# Patient Record
Sex: Female | Born: 1980 | Race: White | Hispanic: No | Marital: Married | Smoking: Former smoker
Health system: Southern US, Academic
[De-identification: ages and names within clinical notes are randomized; demographics above are authoritative.]

## PROBLEM LIST (undated history)

## (undated) DIAGNOSIS — G4700 Insomnia, unspecified: Secondary | ICD-10-CM

## (undated) DIAGNOSIS — J309 Allergic rhinitis, unspecified: Secondary | ICD-10-CM

## (undated) DIAGNOSIS — K529 Noninfective gastroenteritis and colitis, unspecified: Secondary | ICD-10-CM

## (undated) DIAGNOSIS — F32A Depression, unspecified: Secondary | ICD-10-CM

## (undated) DIAGNOSIS — Z28310 Unvaccinated for covid-19: Secondary | ICD-10-CM

## (undated) DIAGNOSIS — Z2821 Immunization not carried out because of patient refusal: Secondary | ICD-10-CM

## (undated) DIAGNOSIS — Z9049 Acquired absence of other specified parts of digestive tract: Secondary | ICD-10-CM

## (undated) DIAGNOSIS — F419 Anxiety disorder, unspecified: Secondary | ICD-10-CM

## (undated) DIAGNOSIS — R519 Headache, unspecified: Secondary | ICD-10-CM

## (undated) HISTORY — PX: KNEE ARTHROSCOPY: SUR90

## (undated) HISTORY — PX: HX CARPAL TUNNEL RELEASE: SHX101

## (undated) HISTORY — PX: HX HYSTERECTOMY: SHX81

## (undated) HISTORY — DX: Insomnia, unspecified: G47.00

## (undated) HISTORY — DX: Headache, unspecified: R51.9

## (undated) HISTORY — PX: VASCULAR SURGERY: SHX849

## (undated) HISTORY — DX: Acquired absence of other specified parts of digestive tract: Z90.49

## (undated) HISTORY — DX: Noninfective gastroenteritis and colitis, unspecified: K52.9

## (undated) HISTORY — DX: Anxiety disorder, unspecified: F41.9

## (undated) HISTORY — DX: Allergic rhinitis, unspecified: J30.9

## (undated) HISTORY — DX: Depression, unspecified: F32.A

## (undated) HISTORY — DX: Unvaccinated for covid-19: Z28.310

## (undated) HISTORY — DX: Immunization not carried out because of patient refusal: Z28.21

---
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
## 2019-12-08 IMAGING — MG 3D SCREENING MAMMO BIL W/CAD
5 series · 7 of 24 positions shown · non-contrast
Comparison: None.  This is a baseline exam.

------------- REPORT [id number] -------------
[facility]
[address]
Daina Ms.TIGER, ALREEM:
We wish to report the following on your recent mammography examination. We are sending a report to your referring physician or other health care provider. 
(       Normal/Negative:
No evidence of cancer.
This statement is mandated by the [place], Department of Health.
Your examination was performed by one of our technologists, who are registered radiological technologists and also specially certified in mammography:
___
Parlak, Edaly (M)
Nepomuceno, Martinez (M)

Your mammogram was interpreted by our radiologist.
( 
Sofeine Made, M.D.
(Annual Breast Examination by a physician or other health care provider
(Annual Mammography Screening beginning at age 40
(Monthly Breast Self Examination
------------- REPORT [id number] -------------
EXAM:  3D BILATERAL ANNUAL SCREENING DIGITAL MAMMOGRAM WITH CAD AND TOMOSYNTHESIS
INDICATION: Screening.

[R]
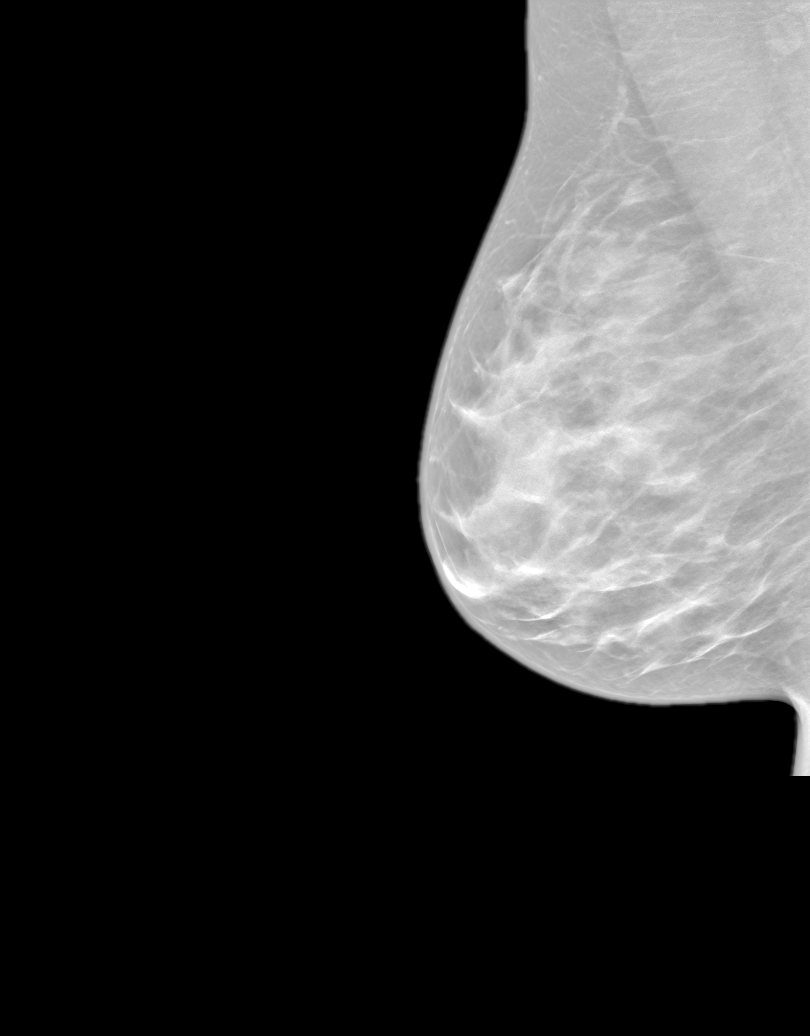

[Series 5715: R CC · right · 2 of 2 slices shown]
[im 1/2]
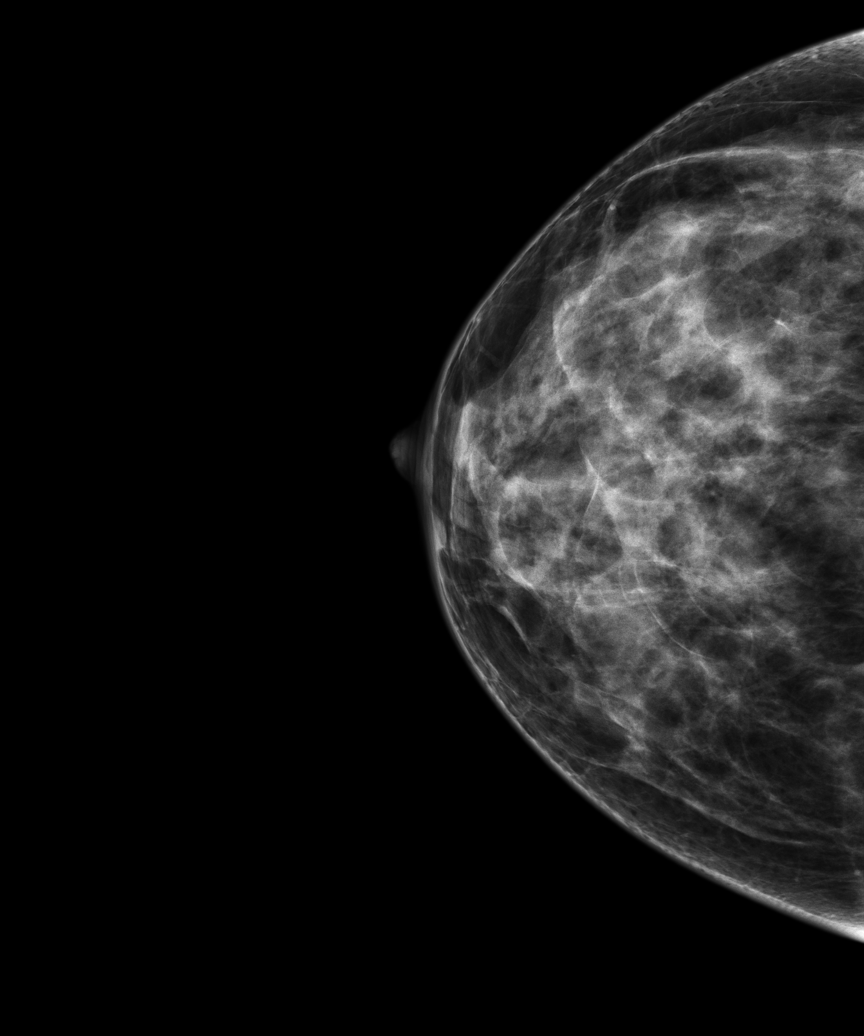
[im 2/2]
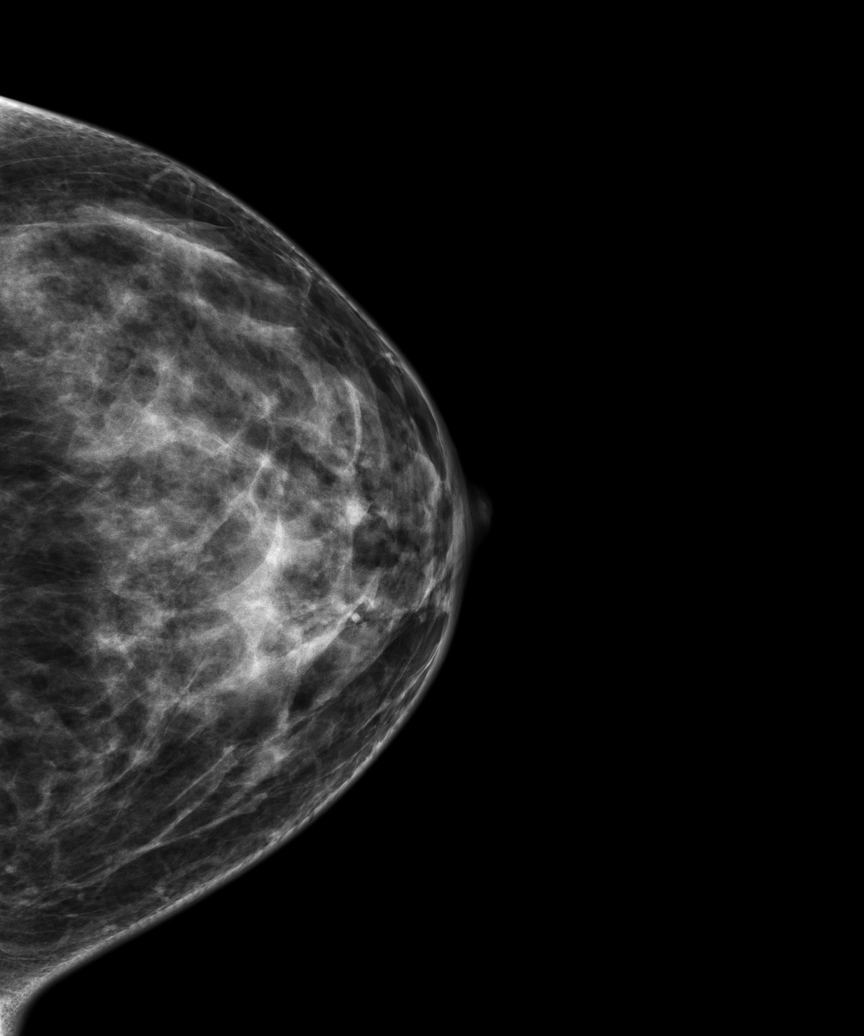

[Series 5717: 3D SCREENING MAMMO BIL W/CAD · 2 acquisitions, 2 frames shown (1 of 2)]
[im 1/2]
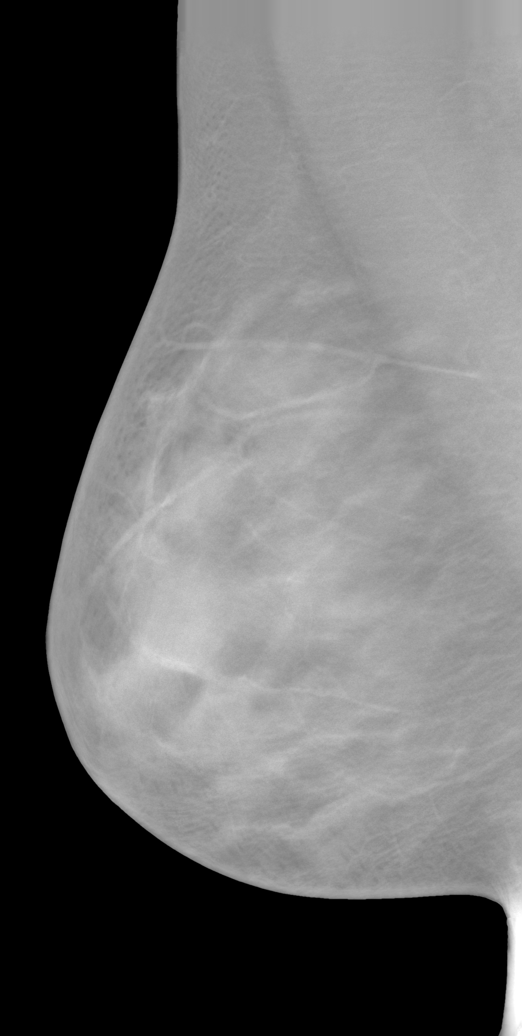
[im 2/2]
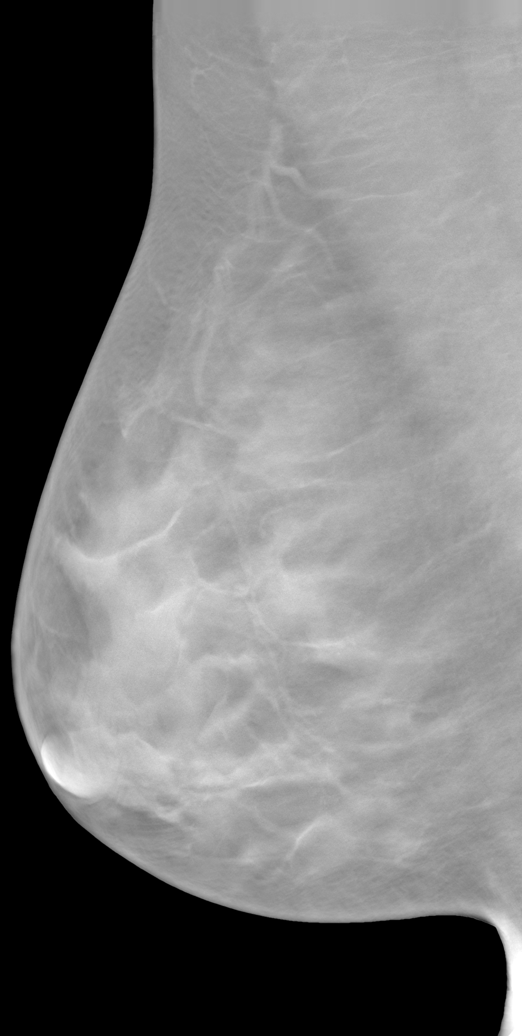

[3D SCREENING MAMMO BIL W/CAD (2 of 2) · tomo slice 11/68.0]
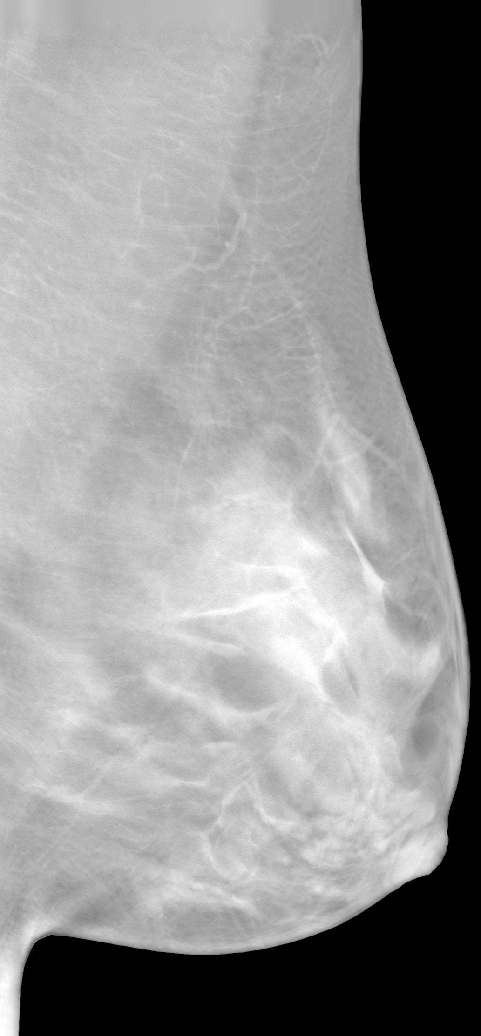

[L]
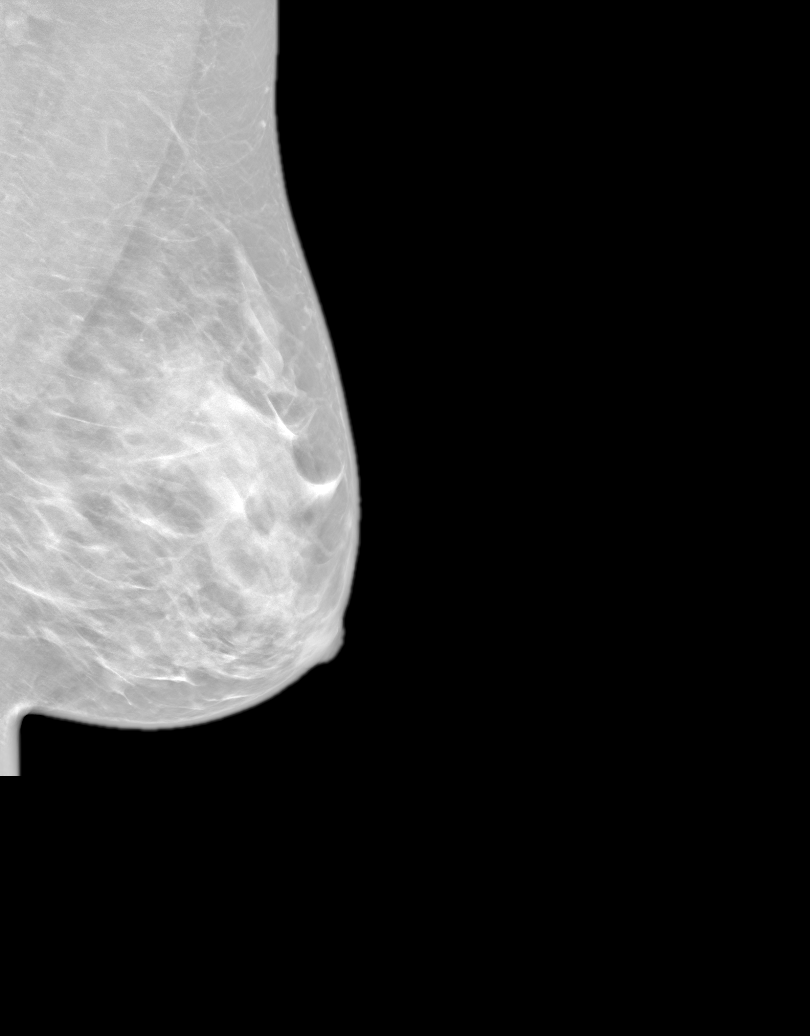

[7 of 24 positions shown; findings below may reference images not displayed]

FINDINGS: Breast parenchyma is heterogeneously dense.  There is no mass or suspicious cluster of microcalcifications.  There is no architectural distortion, skin thickening or nipple retraction.
IMPRESSION: 1.  BIRADS 2-Benign findings. Patient has been added in a reminder system with a target date for the next screening mammography.

2.  DENSITY CODE – C (Heterogeneously dense). 

Final Assessment Code:

Bi-Rads 2 

BI-RADS 0
Need additional imaging evaluation.

BI-RADS 1
Negative mammogram.

BI-RADS 2
Benign finding.

BI-RADS 3
Probably benign finding; short-interval follow-up suggested.

BI-RADS 4
Suspicious abnormality; biopsy should be considered.

BI-RADS 5
Highly suggestive of malignancy; appropriate action should be taken.

BI-RADS 6
Known biopsy-proven malignancy; appropriate action should be taken.

NOTE:
In compliance with Federal regulations, the results of this mammogram are being sent to the patient.

## 2020-11-28 DIAGNOSIS — N393 Stress incontinence (female) (male): Secondary | ICD-10-CM | POA: Insufficient documentation

## 2020-11-30 DIAGNOSIS — R102 Pelvic and perineal pain: Secondary | ICD-10-CM | POA: Insufficient documentation

## 2020-11-30 DIAGNOSIS — N941 Unspecified dyspareunia: Secondary | ICD-10-CM | POA: Insufficient documentation

## 2021-02-18 DIAGNOSIS — F32A Depression, unspecified: Secondary | ICD-10-CM | POA: Insufficient documentation

## 2021-02-18 DIAGNOSIS — G4700 Insomnia, unspecified: Secondary | ICD-10-CM | POA: Insufficient documentation

## 2021-02-18 DIAGNOSIS — F419 Anxiety disorder, unspecified: Secondary | ICD-10-CM | POA: Insufficient documentation

## 2021-02-18 DIAGNOSIS — J302 Other seasonal allergic rhinitis: Secondary | ICD-10-CM | POA: Insufficient documentation

## 2021-07-02 IMAGING — MG 3D SCREENING MAMMO BIL W/CAD & TOMO
5 series · 8 of 24 positions shown · non-contrast
Comparison: 01/12/2021

------------- REPORT [id number] -------------
﻿

EXAM:  3D BILATERAL ANNUAL SCREENING DIGITAL MAMMOGRAM WITH CAD AND TOMOSYNTHESIS
INDICATION: Screening.

[R]
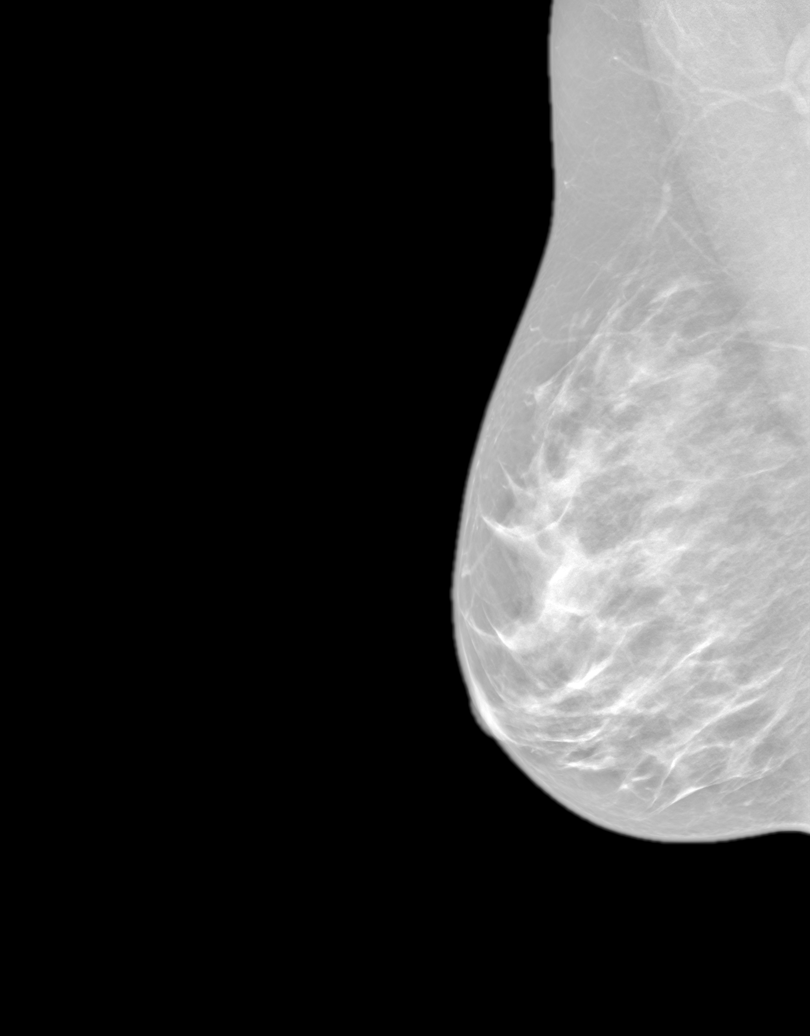

[L]
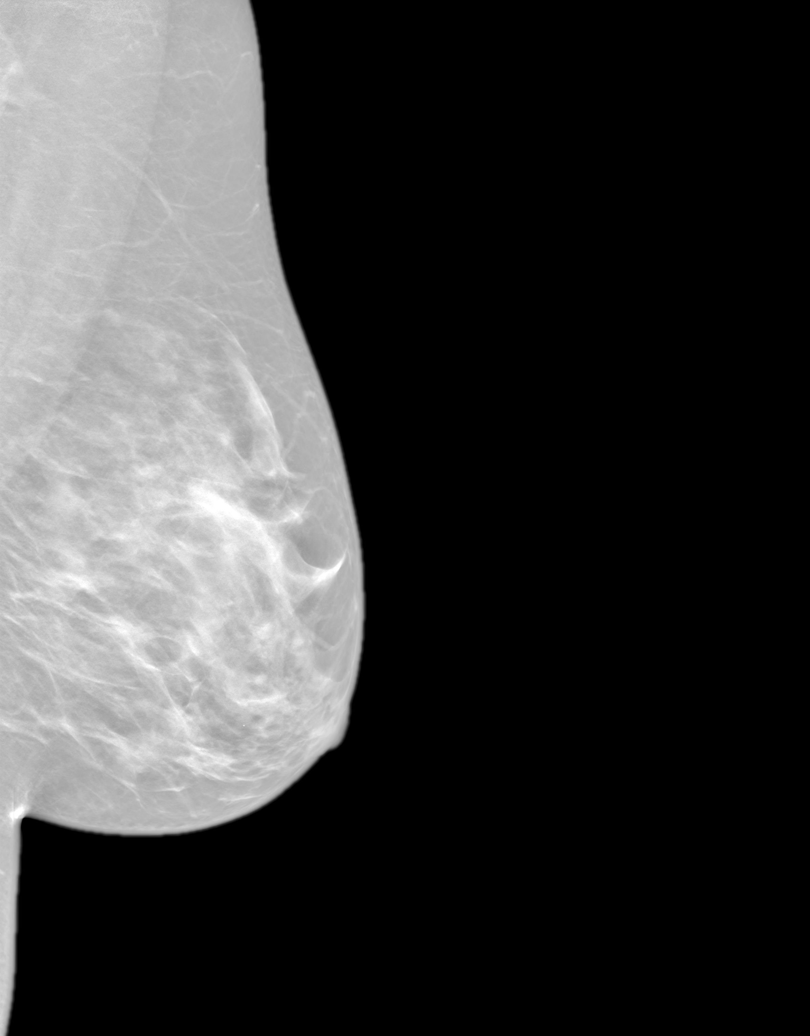

[R CC tomo · right · 0.10mm/px · 2 of 2 slices shown]
[im 1/2]
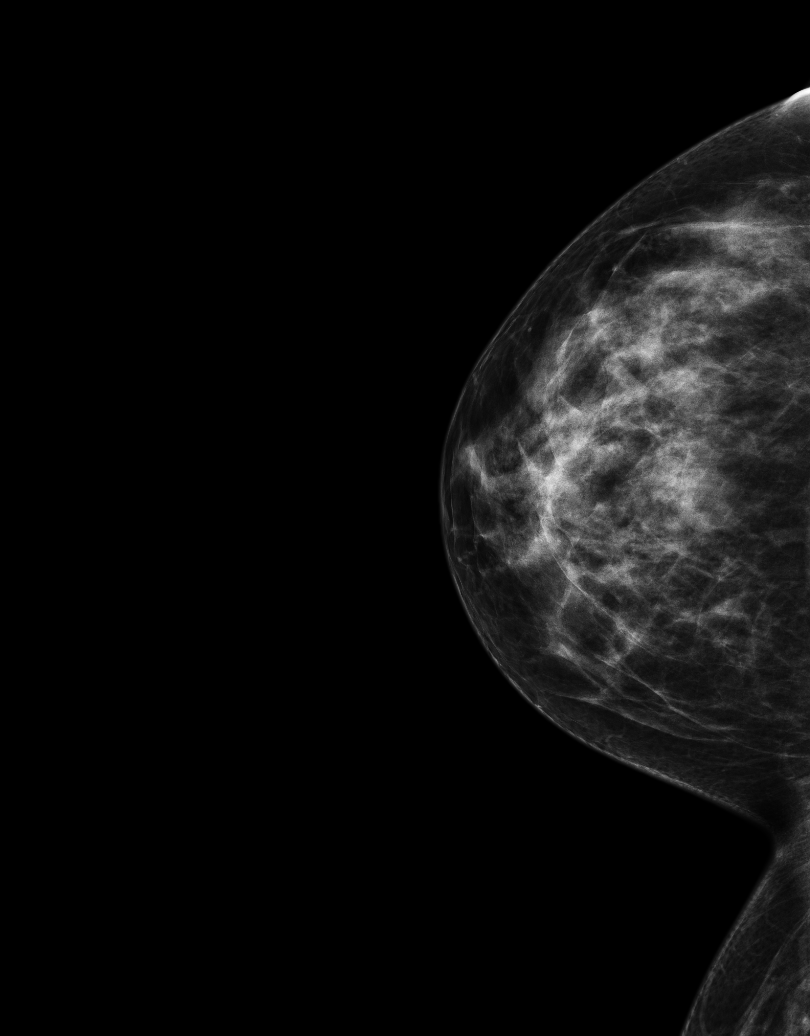
[im 2/2]
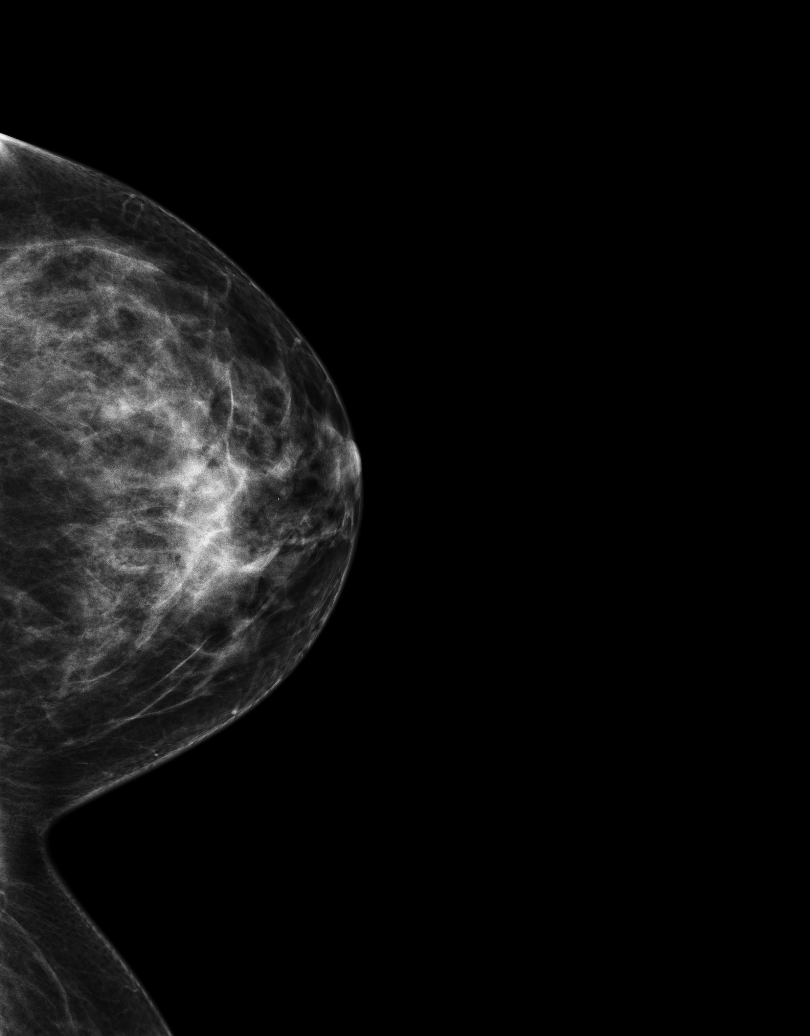

[3D SCREENING MAMMO BIL W/CAD & TOMO tomo · 2 acquisitions, 3 frames shown (1 of 2)]
[im 1/2]
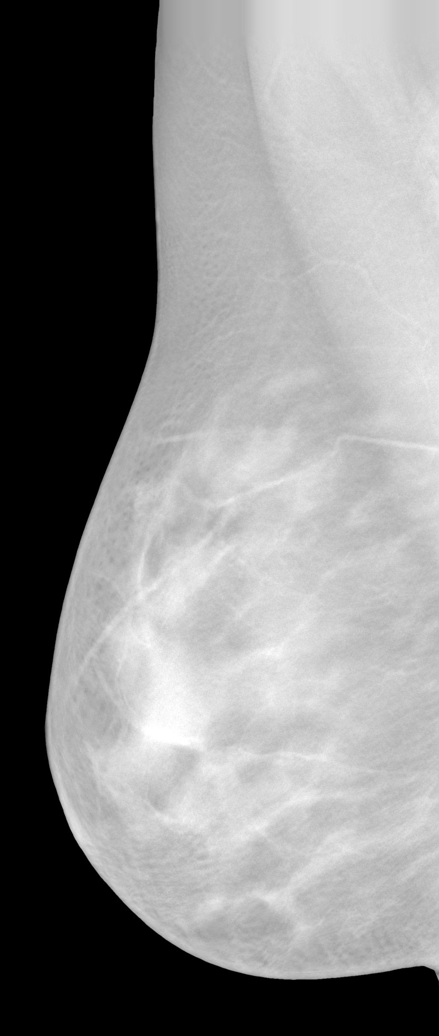
[im 2/2]
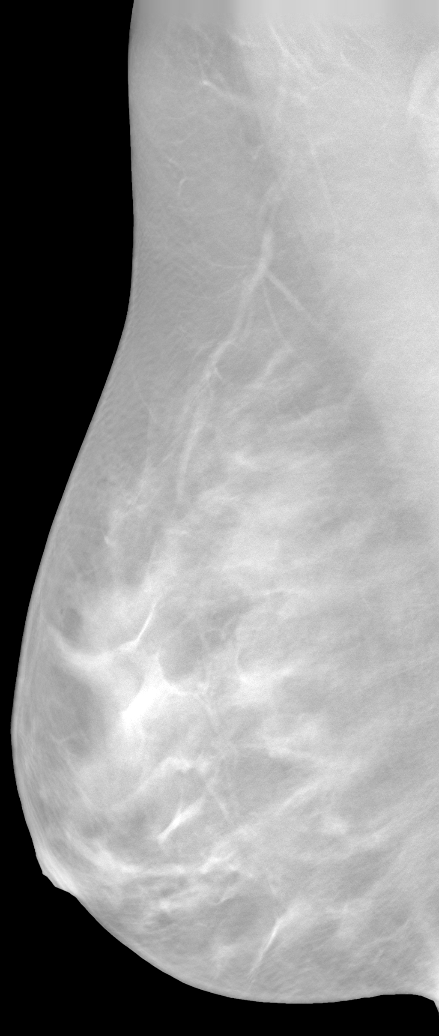
[im 2/2]
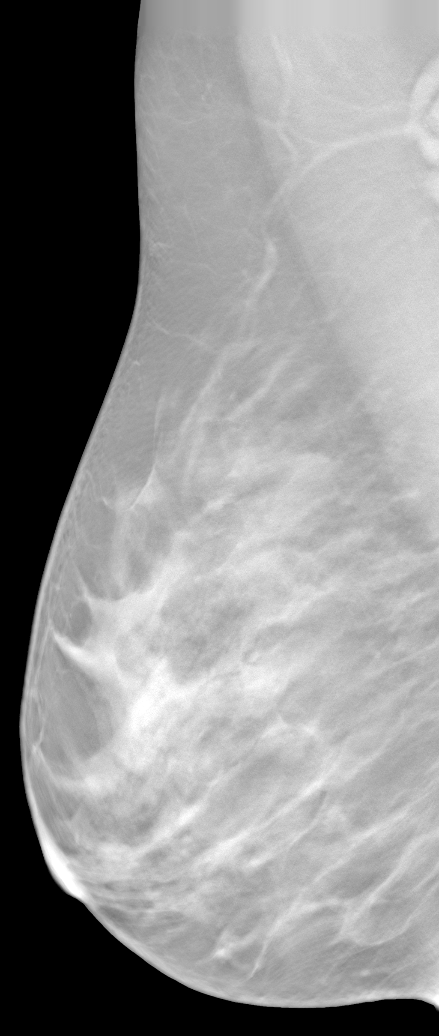

[3D SCREENING MAMMO BIL W/CAD & TOMO tomo (2 of 2) · tomo slice 13/78.0]
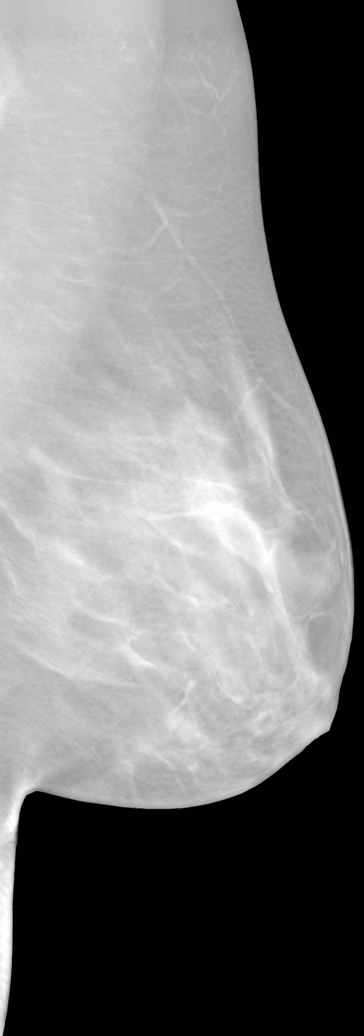

[8 of 24 positions shown; findings below may reference images not displayed]

FINDINGS: Breast parenchyma is heterogeneously dense.  There is no mass or suspicious cluster of microcalcifications.  There is no architectural distortion, skin thickening or nipple retraction.
IMPRESSION: 1.  BIRADS 2-Benign findings. Patient has been added in a reminder system with a target date for the next screening mammography.

2.  DENSITY CODE – C (Heterogeneously dense). 

Final Assessment Code:

Bi-Rads 2 

BI-RADS 0
 Need additional imaging evaluation.

BI-RADS 1
 Negative mammogram.

BI-RADS 2
 Benign finding.

BI-RADS 3
 Probably benign finding; short-interval follow-up suggested.

BI-RADS 4
 Suspicious abnormality; biopsy should be considered.

BI-RADS 5
 Highly suggestive of malignancy; appropriate action should be taken.

BI-RADS 6
 Known biopsy-proven malignancy; appropriate action should be taken.

NOTE:
In compliance with Federal regulations, the results of this mammogram are being sent to the patient.

------------- REPORT [id number] -------------
[facility]
[address]
Tiger Ms.ANTONANGELI, ARANEAE:
We wish to report the following on your recent mammography examination. We are sending a report to your referring physician or other health care provider. 
(       Normal/Negative:
No evidence of cancer.
This statement is mandated by the [place], Department of Health.
Your examination was performed by one of our technologists, who are registered radiological technologists and also specially certified in mammography:
___
Markland, Marjuan (M)

Your mammogram was interpreted by our radiologist.

( 
Collette Sedman, M.D.

(Annual Breast Examination by a physician or other health care provider
(Annual Mammography Screening beginning at age 40
(Monthly Breast Self Examination

## 2021-08-15 ENCOUNTER — Encounter (RURAL_HEALTH_CENTER): Payer: Self-pay | Admitting: Physician Assistant

## 2021-08-15 DIAGNOSIS — J309 Allergic rhinitis, unspecified: Secondary | ICD-10-CM | POA: Insufficient documentation

## 2021-08-15 DIAGNOSIS — F32A Depression, unspecified: Secondary | ICD-10-CM | POA: Insufficient documentation

## 2021-08-15 DIAGNOSIS — F419 Anxiety disorder, unspecified: Secondary | ICD-10-CM | POA: Insufficient documentation

## 2021-09-17 ENCOUNTER — Other Ambulatory Visit: Payer: Self-pay

## 2021-09-17 ENCOUNTER — Encounter (RURAL_HEALTH_CENTER): Payer: Self-pay | Admitting: Physician Assistant

## 2021-09-17 ENCOUNTER — Ambulatory Visit (RURAL_HEALTH_CENTER): Payer: Commercial Managed Care - PPO | Attending: Physician Assistant | Admitting: Physician Assistant

## 2021-09-17 VITALS — BP 120/88 | HR 74 | Temp 97.7°F | Ht 64.0 in | Wt 201.0 lb

## 2021-09-17 DIAGNOSIS — Z28310 Unvaccinated for covid-19: Secondary | ICD-10-CM | POA: Insufficient documentation

## 2021-09-17 DIAGNOSIS — Z2821 Immunization not carried out because of patient refusal: Secondary | ICD-10-CM | POA: Insufficient documentation

## 2021-09-17 DIAGNOSIS — R5382 Chronic fatigue, unspecified: Secondary | ICD-10-CM | POA: Insufficient documentation

## 2021-09-17 DIAGNOSIS — F419 Anxiety disorder, unspecified: Secondary | ICD-10-CM | POA: Insufficient documentation

## 2021-09-17 DIAGNOSIS — Z79899 Other long term (current) drug therapy: Secondary | ICD-10-CM | POA: Insufficient documentation

## 2021-09-17 DIAGNOSIS — R040 Epistaxis: Secondary | ICD-10-CM | POA: Insufficient documentation

## 2021-09-17 DIAGNOSIS — R079 Chest pain, unspecified: Secondary | ICD-10-CM | POA: Insufficient documentation

## 2021-09-17 DIAGNOSIS — F32A Depression, unspecified: Secondary | ICD-10-CM | POA: Insufficient documentation

## 2021-09-17 DIAGNOSIS — R5383 Other fatigue: Secondary | ICD-10-CM

## 2021-09-17 DIAGNOSIS — R519 Headache, unspecified: Secondary | ICD-10-CM | POA: Insufficient documentation

## 2021-09-17 DIAGNOSIS — R030 Elevated blood-pressure reading, without diagnosis of hypertension: Secondary | ICD-10-CM | POA: Insufficient documentation

## 2021-09-17 MED ORDER — AMLODIPINE 2.5 MG TABLET
2.5000 mg | ORAL_TABLET | Freq: Every day | ORAL | 4 refills | Status: DC
Start: 2021-09-17 — End: 2021-12-17

## 2021-09-17 MED ORDER — ESCITALOPRAM 10 MG TABLET
10.0000 mg | ORAL_TABLET | Freq: Every day | ORAL | 1 refills | Status: DC
Start: 2021-09-17 — End: 2021-10-03

## 2021-09-17 MED ORDER — BUTALBITAL-ASPIRIN-CAFFEINE 50 MG-325 MG-40 MG CAPSULE
1.0000 | ORAL_CAPSULE | ORAL | 2 refills | Status: AC | PRN
Start: 2021-09-17 — End: 2021-10-17

## 2021-09-17 NOTE — Nursing Note (Signed)
Patient here today for 3 months CDM .Been having headaches x 2 weeks.

## 2021-09-17 NOTE — Progress Notes (Incomplete)
INTERNAL MEDICINE, [facility]  [address]  [place] [address]  Operated by [facility]  History and Physical     Name: Stacy Cantrell MRN:  [id number]   Date: 09/17/2021 Age: 41 y.o.               Follow Up        Reason for Visit: Follow Up 3 Months (Headache x 2 weeks)    History of Present Illness  Stacy Cantrell is a 41 y.o. female who is being seen today for ***      Follow-up chronic anxiety/depression with patient currently taking Lexapro 10 mg daily along with BuSpar as needed; at this current time she states that she is overall doing well on current medications; she has been off of work so feels like her anxiety is improved; she denies any panic attacks suicidal thoughts or new issues    Patient does have chronic fatigue issues noted and states that ever since having COVID a year ago seems like her energy level has never been same; currently at this time however she has no hair loss dry skin constipation and  thyroid levels noted normal on last labs but pt due today; no previous history of hypothyroidism noted    Patient refuses COVID-vaccine also    PHQ Questionnaire  Little interest or pleasure in doing things.: Not at all  Feeling down, depressed, or hopeless: Not at all  PHQ 2 Total: 0            Past Medical History:   Diagnosis Date    Allergic rhinitis     Anxiety     COVID-19 vaccine series declined     Depression     Insomnia disorder          Past Surgical History:   Procedure Laterality Date    HX CARPAL TUNNEL RELEASE Right     HX HYSTERECTOMY      VASCULAR SURGERY Right     vein stripped from right leg      Family Medical History:     Problem Relation (Age of Onset)    Breast Cancer Other    Cervical Cancer Other    Diabetes type II Maternal Grandmother    Elevated Lipids Father    Hypertension (High Blood Pressure) Father, Paternal Grandmother          Social History     Tobacco Use    Smoking status: Former      Types: Cigarettes     Quit date: 2016     Years since quitting: 7.2    Smokeless tobacco: Never   Substance Use Topics    Alcohol use: Never    Drug use: Never     Medication:  busPIRone (BUSPAR) 10 mg Oral Tablet, Take 1 Tablet (10 mg total) by mouth Three times a day as needed (Anxiety) (Patient not taking: Reported on 09/17/2021)  cetirizine (ZYRTEC) 10 mg Oral Tablet, Take 1 Tablet (10 mg total) by mouth Once per day as needed for Allergies  EPINEPHrine 0.3 mg/0.3 mL Injection Auto-Injector, Inject 0.3 mL (0.3 mg total) into the muscle Once, as needed  multivitamin-iron-folic acid (CENTRUM) 18-400 mg-mcg Oral Tablet, Take 1 Tablet by mouth Once a day  escitalopram oxalate (LEXAPRO) 10 mg Oral Tablet, Take 1 Tablet (10 mg total) by mouth Once a day    No facility-administered medications prior to visit.    Allergies:  Allergies  Allergen Reactions    Codeine     Penicillins     Diphenhydramine Hives/ Urticaria       Physical Exam:  Vitals:    09/17/21 1029   BP: 120/88   Pulse: 74   Temp: 36.5 C (97.7 F)   TempSrc: Tympanic   SpO2: 100%   Weight: 91.2 kg (201 lb)   Height: 1.626 m (5\' 4" )   BMI: 34.57      Physical Exam    Assessment/Plan:  Problem List Items Addressed This Visit        Psychiatric    Depression - Primary    Relevant Medications    escitalopram oxalate (LEXAPRO) 10 mg Oral Tablet                 Follow up: No follow-ups on file.  Seek medical attention for new or worsening symptoms.  Patient has been seen in this clinic within the last 3 years.     Travas Schexnayder, PA-C          This note was partially created using MModal Fluency Direct system (voice recognition software) and is inherently subject to errors including those of syntax and "sound-alike" substitutions which may escape proofreading.  In such instances, original meaning may be extrapolated by contextual derivation.

## 2021-09-18 ENCOUNTER — Telehealth (RURAL_HEALTH_CENTER): Payer: Self-pay | Admitting: Physician Assistant

## 2021-09-18 NOTE — Telephone Encounter (Signed)
Patient called and left voicemail that she had an appointment yesterday and mentioned to you that the Buspar she was taking was making her sick, but she did not know if you were planning on changing the medication or what the plan was due to she did not mention yesterday that she wanted the medication changed.

## 2021-09-24 NOTE — Telephone Encounter (Signed)
Only other thing I see is Lexapro 10 mg once daily

## 2021-09-24 NOTE — Telephone Encounter (Signed)
I guess she just wants the medication changed from Buspar to something else. She was taking Buspar 10 mg three times daily.

## 2021-10-03 MED ORDER — ESCITALOPRAM 20 MG TABLET
20.0000 mg | ORAL_TABLET | Freq: Every day | ORAL | 1 refills | Status: DC
Start: 2021-10-03 — End: 2021-11-04

## 2021-10-03 NOTE — Telephone Encounter (Signed)
I called patient and she stated that she would be willing to try the increase dosage of Lexapro at 20 mg once daily. The medication was sent to the pharmacy.

## 2021-10-08 ENCOUNTER — Encounter (RURAL_HEALTH_CENTER): Payer: Self-pay | Admitting: Physician Assistant

## 2021-10-19 NOTE — Progress Notes (Incomplete)
INTERNAL MEDICINE, [facility]  [address]  [place] [address]  Operated by [facility]  History and Physical     Name: Stacy Cantrell MRN:  [id number]   Date: 09/17/2021 Age: 41 y.o.               Follow Up        Reason for Visit: Follow Up 3 Months (Headache x 2 weeks)    History of Present Illness  Stacy Cantrell is a 41 y.o. female who is being seen today for several issues.     Pt reports some midsternal CP off and on poss assoc w elevated BP readings and headaches she has been having now for 1 month. Pt states she has also had several pretty severe nose bleeds that were controlled at home and did not require eval at ER. Daily headaches noted as well w pain around her head like a band / pressure like however occ shooting behind her eyes w/o vision changes dizziness. Initial BP 120/88 w repeat LT 138/90 RT 142/90. Pt states BP usually  chronic anxiety/depression with patient currently taking Lexapro 10 mg daily along with BuSpar as needed; at this current time she states that she is overall doing well on current medications; she has been off of work so feels like her anxiety is improved; she denies any panic attacks suicidal thoughts or new issues    Patient does have chronic fatigue issues noted and states that ever since having COVID a year ago seems like her energy level has never been same; currently at this time however she has no hair loss dry skin constipation and  thyroid levels noted normal on last labs but pt due today; no previous history of hypothyroidism noted    Patient refuses COVID-vaccine also    PHQ Questionnaire  Little interest or pleasure in doing things.: Not at all  Feeling down, depressed, or hopeless: Not at all  PHQ 2 Total: 0            Past Medical History:   Diagnosis Date   . Allergic rhinitis    . Anxiety    . COVID-19 vaccine series declined    . Depression    . Insomnia disorder          Past Surgical History:    Procedure Laterality Date   . HX CARPAL TUNNEL RELEASE Right    . HX HYSTERECTOMY     . 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 Smoking status: Former     Types: Cigarettes     Quit date: 2016     Years since quitting: 7.2   . Smokeless tobacco: Never   Substance Use Topics   . Alcohol use: Never   . Drug use: Never     Medication:  busPIRone (BUSPAR) 10 mg Oral Tablet, Take 1 Tablet (10 mg total) by mouth Three times a day as needed (Anxiety) (Patient not taking: Reported on 09/17/2021)  cetirizine (ZYRTEC) 10 mg Oral Tablet,  Take 1 Tablet (10 mg total) by mouth Once per day as needed for Allergies  EPINEPHrine 0.3 mg/0.3 mL Injection Auto-Injector, Inject 0.3 mL (0.3 mg total) into the muscle Once, as needed  multivitamin-iron-folic acid (CENTRUM) 18-400 mg-mcg Oral Tablet, Take 1 Tablet by mouth Once a day  escitalopram oxalate (LEXAPRO) 10 mg Oral Tablet, Take 1 Tablet (10 mg total) by mouth Once a day    No facility-administered medications prior to visit.    Allergies:  Allergies   Allergen Reactions   . Codeine    . Penicillins    . Diphenhydramine Hives/ Urticaria       Physical Exam:  Vitals:    09/17/21 1029   BP: 120/88   Pulse: 74   Temp: 36.5 C (97.7 F)   TempSrc: Tympanic   SpO2: 100%   Weight: 91.2 kg (201 lb)   Height: 1.626 m (5\' 4" )   BMI: 34.57      Physical Exam    Assessment/Plan:  Problem List Items Addressed This Visit        Psychiatric    Depression - Primary    Relevant Medications    escitalopram oxalate (LEXAPRO) 10 mg Oral Tablet                 Follow up: No follow-ups on file.  Seek medical attention for new or worsening symptoms.  Patient has been seen in this clinic within the  last 3 years.     Tzirel Leonor, PA-C          This note was partially created using MModal Fluency Direct system (voice recognition software) and is inherently subject to errors including those of syntax and "sound-alike" substitutions which may escape proofreading.  In such instances, original meaning may be extrapolated by contextual derivation.

## 2021-10-20 DIAGNOSIS — R5382 Chronic fatigue, unspecified: Secondary | ICD-10-CM | POA: Insufficient documentation

## 2021-10-22 ENCOUNTER — Ambulatory Visit (RURAL_HEALTH_CENTER): Payer: Self-pay | Admitting: Physician Assistant

## 2021-11-02 ENCOUNTER — Other Ambulatory Visit (RURAL_HEALTH_CENTER): Payer: Self-pay | Admitting: Physician Assistant

## 2021-11-11 ENCOUNTER — Other Ambulatory Visit (RURAL_HEALTH_CENTER): Payer: Self-pay | Admitting: Physician Assistant

## 2021-11-11 DIAGNOSIS — G8929 Other chronic pain: Secondary | ICD-10-CM

## 2021-11-29 ENCOUNTER — Ambulatory Visit (INDEPENDENT_AMBULATORY_CARE_PROVIDER_SITE_OTHER): Payer: PRIVATE HEALTH INSURANCE | Admitting: Surgery

## 2021-11-29 ENCOUNTER — Encounter (INDEPENDENT_AMBULATORY_CARE_PROVIDER_SITE_OTHER): Payer: Self-pay | Admitting: Surgery

## 2021-11-29 ENCOUNTER — Other Ambulatory Visit: Payer: Self-pay

## 2021-11-29 DIAGNOSIS — I83813 Varicose veins of bilateral lower extremities with pain: Secondary | ICD-10-CM

## 2021-11-29 DIAGNOSIS — G5763 Lesion of plantar nerve, bilateral lower limbs: Secondary | ICD-10-CM

## 2021-11-29 DIAGNOSIS — O2200 Varicose veins of lower extremity in pregnancy, unspecified trimester: Secondary | ICD-10-CM | POA: Insufficient documentation

## 2021-11-29 DIAGNOSIS — R202 Paresthesia of skin: Secondary | ICD-10-CM

## 2021-11-29 NOTE — H&P (Signed)
GENERAL [facility]GERY, [facility]  [address]  [place] [address]    History and Physical     Name: Stacy Cantrell CROUCHER MRN:  [id number]   Date: 11/29/2021 Age: 41 y.o.         H&P    Referring Provider: No ref. provider found     Chief Complaint:  Leg Pain and Numbness/Tingling Of Leg (It has been going on for since last aug 22 she had a Vein stripped from that leg )       HPI:  I saw this patient in 2021 for carpal tunnel syndrome but since then she has had bilateral carpal tunnel decompression done in [facility] and does not have any complaints regarding hands.    She has a burning sensations in both feet and sometimes it feels like she is walking on gravel but she also complain some pain behind the knees more so on the right.  She has had vein stripping done for the right lower extremity in the distant past.  Does not get any swelling of the ankles and there is no history suggestive of intermittent claudication or ischemic rest pains.   She is scheduled for a nerve conduction studies for the ulnar nerves this month.    ROS  Patient has seen a podiatrist and was told that she has Morton's neuroma and a small cyst of the right 2nd toe but no surgical intervention has been done yet.  She does not have any transient ischemic attacks.  No active chest pains or palpitations.  No cough hemoptysis or dyspnea.  No fever chills or night sweats.  No abdominal complaints.  No urinary complaints.    Previous records reviewed    Past Medical History:  Past Medical History:   Diagnosis Date   . 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 Allergic rhinitis    . Anxiety    . COVID-19 vaccine series declined    . Depression    . Gastroenteritis    . Insomnia disorder          Past Surgical History:   Procedure Laterality Date   . HX CARPAL TUNNEL RELEASE Right    . HX HYSTERECTOMY     . VASCULAR SURGERY Right     vein stripped from right leg         Family Medical History:     Problem Relation  (Age of Onset)    Breast Cancer Other    Cervical Cancer Other    Diabetes type II Maternal Grandmother    Elevated Lipids Father    Hypertension (High Blood Pressure) Father, Paternal Grandmother           Social History     Tobacco Use   . Smoking status: Former     Types: Cigarettes     Quit date: 2016     Years since quitting: 7.4   . Smokeless tobacco: Never   Vaping Use   . Vaping Use: Never used   Substance Use Topics   . Alcohol use: Never   . Drug use: Never        Allergies:  Allergies   Allergen Reactions   . Codeine Hives/ Urticaria   . Diphenhydramine Hcl Hives/ Urticaria   . Penicillins Hives/ Urticaria   . Diphenhydramine Hives/ Urticaria        Medications:  Current Outpatient Medications   Medication Sig   . amLODIPine (NORVASC) 2.5 mg  Oral Tablet Take 1 Tablet (2.5 mg total) by mouth Once a day   . busPIRone (BUSPAR) 10 mg Oral Tablet Take 1 Tablet (10 mg total) by mouth Three times a day as needed (Anxiety)   . cetirizine (ZYRTEC) 10 mg Oral Tablet Take 1 Tablet (10 mg total) by mouth Once per day as needed for Allergies   . EPINEPHrine 0.3 mg/0.3 mL Injection Auto-Injector Inject 0.3 mL (0.3 mg total) into the muscle Once, as needed   . escitalopram oxalate (LEXAPRO) 20 mg Oral Tablet TAKE 1 TABLET BY MOUTH ONCE A DAY   . meloxicam (MOBIC) 15 mg Oral Tablet TAKE 1 TABLET BY MOUTH EVERY DAY AS NEEDED FOR PAIN WITH FOOD   . multivitamin-iron-folic acid (CENTRUM) 18-400 mg-mcg Oral Tablet Take 1 Tablet by mouth Once a day        Physical Exam:    Objective:  Vital: BP (!) 147/86 (Site: Right, Patient Position: Sitting, Cuff Size: Adult)   Pulse 85   Temp 36.3 C (97.4 F)   Resp 18   Ht 1.6 m (5\' 3" )   Wt 90.7 kg (200 lb)   SpO2 94%   BMI 35.43 kg/m     Patient is awake and alert and fully oriented and in no distress.  Vital signs are stable.    Lungs are clear   Heart sounds normal   Abdomen is soft and benign.    There is no ankle edema or calf tenderness.  I am having difficulty feeling  the femoral pulses that could be due to the body habitus because I clearly feel pedal pulses on both sides dorsalis pedis as well as posterior tibial and there is quick capillary filling at the toes.  No obvious neurologic deficit is noted.  On side-to-side compression of the toes patient does complain pain at the metatarsophalangeal areas more so on the right side which could be from the Morton's neuroma.    Some residual varicose visible veins noted in the popliteal area on the right.  No signs of thrombophlebitis is noted.    Assessment and Plan:    ICD-10-CM    1. Morton's neuroma of both feet  G57.63       2. 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  She has had vein stripping done in the right lower extremity and some residual varicosity is noted however there is no evidence of any active thrombophlebitis rate is unlikely that her symptoms are regarding that particularly considering the burning sensation in the feet and legs but this can be checked in the vein clinic if the problem persists.  I have advised her have it checked by neurologist because she is already seeing 1 regarding upper extremities and she would also check again with the podiatrist regarding the Morton's neuroma.    I will see her back here if needed.  Follow Up:  No follow-ups on file.    , MD

## 2021-12-12 ENCOUNTER — Telehealth (INDEPENDENT_AMBULATORY_CARE_PROVIDER_SITE_OTHER): Payer: Self-pay | Admitting: Physician Assistant

## 2021-12-17 ENCOUNTER — Ambulatory Visit: Payer: Commercial Managed Care - PPO | Attending: Physician Assistant | Admitting: Physician Assistant

## 2021-12-17 ENCOUNTER — Other Ambulatory Visit: Payer: Self-pay

## 2021-12-17 ENCOUNTER — Encounter (INDEPENDENT_AMBULATORY_CARE_PROVIDER_SITE_OTHER): Payer: Self-pay | Admitting: Physician Assistant

## 2021-12-17 VITALS — BP 130/92 | HR 80 | Temp 98.4°F | Ht 63.0 in | Wt 200.0 lb

## 2021-12-17 DIAGNOSIS — I10 Essential (primary) hypertension: Secondary | ICD-10-CM

## 2021-12-17 DIAGNOSIS — R040 Epistaxis: Secondary | ICD-10-CM

## 2021-12-17 DIAGNOSIS — E559 Vitamin D deficiency, unspecified: Secondary | ICD-10-CM

## 2021-12-17 DIAGNOSIS — G5621 Lesion of ulnar nerve, right upper limb: Secondary | ICD-10-CM

## 2021-12-17 DIAGNOSIS — R5382 Chronic fatigue, unspecified: Secondary | ICD-10-CM

## 2021-12-17 DIAGNOSIS — R519 Headache, unspecified: Secondary | ICD-10-CM

## 2021-12-17 DIAGNOSIS — I7300 Raynaud's syndrome without gangrene: Secondary | ICD-10-CM

## 2021-12-17 DIAGNOSIS — M25562 Pain in left knee: Secondary | ICD-10-CM

## 2021-12-17 DIAGNOSIS — F32A Depression, unspecified: Secondary | ICD-10-CM

## 2021-12-17 DIAGNOSIS — F419 Anxiety disorder, unspecified: Secondary | ICD-10-CM

## 2021-12-17 DIAGNOSIS — E7800 Pure hypercholesterolemia, unspecified: Secondary | ICD-10-CM

## 2021-12-17 LAB — LIPID PANEL
CHOL/HDL RATIO: 2.8
CHOLESTEROL: 164 mg/dL (ref <200)
HDL CHOL: 58 mg/dL (ref 23–92)
LDL CALC: 86 mg/dL (ref 0–100)
TRIGLYCERIDES: 102 mg/dL (ref <=150)
VLDL CALC: 20 mg/dL (ref 0–50)

## 2021-12-17 LAB — COMPREHENSIVE METABOLIC PNL, FASTING
ALBUMIN/GLOBULIN RATIO: 1.6 — ABNORMAL HIGH (ref 0.8–1.4)
ALBUMIN: 4.6 g/dL (ref 3.5–5.7)
ALKALINE PHOSPHATASE: 49 U/L (ref 34–104)
ALT (SGPT): 16 U/L (ref 7–52)
ANION GAP: 6 mmol/L — ABNORMAL LOW (ref 10–20)
AST (SGOT): 18 U/L (ref 13–39)
BILIRUBIN TOTAL: 0.6 mg/dL (ref 0.3–1.2)
BUN/CREA RATIO: 15 (ref 6–22)
BUN: 11 mg/dL (ref 7–25)
CALCIUM, CORRECTED: 8.9 mg/dL (ref 8.9–10.8)
CALCIUM: 9.5 mg/dL (ref 8.6–10.3)
CHLORIDE: 107 mmol/L (ref 98–107)
CO2 TOTAL: 25 mmol/L (ref 21–31)
CREATININE: 0.74 mg/dL (ref 0.60–1.30)
ESTIMATED GFR: 105 mL/min/{1.73_m2} (ref >59)
GLOBULIN: 2.8 — ABNORMAL LOW (ref 2.9–5.4)
GLUCOSE: 82 mg/dL (ref 74–109)
OSMOLALITY, CALCULATED: 274 mOsm/kg (ref 270–290)
POTASSIUM: 4.7 mmol/L (ref 3.5–5.1)
PROTEIN TOTAL: 7.4 g/dL (ref 6.4–8.9)
SODIUM: 138 mmol/L (ref 136–145)

## 2021-12-17 LAB — CBC WITH DIFF
BASOPHIL #: 0.1 10*3/uL (ref 0.00–0.30)
BASOPHIL %: 1 % (ref 0–3)
EOSINOPHIL #: 0.1 10*3/uL (ref 0.00–0.80)
EOSINOPHIL %: 1 % (ref 0–7)
HCT: 42.3 % (ref 37.0–47.0)
HGB: 14.5 g/dL (ref 12.5–16.0)
LYMPHOCYTE #: 1.7 10*3/uL (ref 1.10–5.00)
LYMPHOCYTE %: 19 % — ABNORMAL LOW (ref 25–45)
MCH: 29.2 pg (ref 27.0–32.0)
MCHC: 34.3 g/dL (ref 32.0–36.0)
MCV: 85.1 fL (ref 78.0–99.0)
MONOCYTE #: 0.5 10*3/uL (ref 0.00–1.30)
MONOCYTE %: 6 % (ref 0–12)
MPV: 7.8 fL (ref 7.4–10.4)
NEUTROPHIL #: 6.5 10*3/uL (ref 1.80–8.40)
NEUTROPHIL %: 74 % (ref 40–76)
PLATELETS: 146 10*3/uL (ref 140–440)
RBC: 4.97 10*6/uL (ref 4.20–5.40)
RDW: 13.2 % (ref 11.6–14.8)
WBC: 8.8 10*3/uL (ref 4.0–10.5)
WBCS UNCORRECTED: 8.8 10*3/uL

## 2021-12-17 LAB — VITAMIN D 25 TOTAL: VITAMIN D: 40 ng/mL (ref 30–100)

## 2021-12-17 LAB — THYROID STIMULATING HORMONE WITH FREE T4 REFLEX: TSH: 1.351 u[IU]/mL (ref 0.450–5.330)

## 2021-12-17 LAB — FOLATE: FOLATE: >24.2 ng/mL (ref 5.9–24.4)

## 2021-12-17 LAB — MAGNESIUM: MAGNESIUM: 2.1 mg/dL (ref 1.9–2.7)

## 2021-12-17 LAB — VITAMIN B12: VITAMIN B 12: >1500 pg/mL — ABNORMAL HIGH (ref 180–914)

## 2021-12-17 MED ORDER — AMLODIPINE 5 MG TABLET
5.0000 mg | ORAL_TABLET | Freq: Every day | ORAL | 1 refills | Status: DC
Start: 2021-12-17 — End: 2022-06-16

## 2021-12-17 MED ORDER — AMLODIPINE 2.5 MG TABLET
2.5000 mg | ORAL_TABLET | Freq: Every day | ORAL | 4 refills | Status: DC
Start: 2021-12-17 — End: 2021-12-17

## 2021-12-17 MED ORDER — CYANOCOBALAMIN (VIT B-12) 1,000 MCG/ML INJECTION SOLUTION
1000.0000 ug | Freq: Once | INTRAMUSCULAR | Status: AC
Start: 2021-12-17 — End: 2021-12-17
  Administered 2021-12-17: 1000 ug via INTRAMUSCULAR

## 2021-12-17 NOTE — Nursing Note (Signed)
Patient is here for routine 3 month follow up and medication refills.

## 2021-12-17 NOTE — Progress Notes (Signed)
INTERNAL MEDICINE, BUILDING A  [address]  [place] [address]  Operated by [facility]  History and Physical     Name: Stacy Cantrell MRN:  [id number]   Date: 12/17/2021 Age: 41 y.o.               Follow Up        Reason for Visit: Follow Up 3 Months (Routine )    History of Present Illness  Stacy Cantrell is a 41 y.o. female who is being seen today for f/u however pt has several complaints today    Pt reports some midsternal CP off and on poss assoc w elevated BP readings and headaches she has been having now for 1 month. Pt states she has also had several pretty severe nose bleeds that were controlled at home and did not require eval at ER. Daily headaches noted as well w pain around her head like a band / pressure like however occ shooting behind her eyes w/o vision changes dizziness. Initial BP 120/88 w repeat LT 138/90 RT 142/90. She denies any dizziness sycope vision changes.   Pt states there is hx of BP issues in her family and poss heart dz. She has never been eval by cardiologist.    Hx of  chronic anxiety/depression with patient currently taking Lexapro 10 mg daily along with BuSpar as needed. She states lately she has been more stressed and anxious so ? If poss causing some of her issues. she denies any panic attacks suicidal thoughts.     Patient does have chronic fatigue issues noted and states that ever since having COVID a year ago seems like her energy level has never been same; currently at this time however she has no hair loss dry skin constipation and last  thyroid levels noted WNL. no previous history of hypothyroidism noted    Patient refuses COVID-vaccine also    PHQ Questionnaire  Little interest or pleasure in doing things.: Not at all  Feeling down, depressed, or hopeless: Not at all  PHQ 2 Total: 0  Trouble falling or staying asleep, or sleeping too much.: Not at all  Feeling tired or having little energy: Not at all  Poor appetite or overeating: Not at  all  Feeling bad about yourself/ that you are a failure in the past 2 weeks?: Not at all  Trouble concentrating on things in the past 2 weeks?: Not at all  Moving/Speaking slowly or being fidgety or restless  in the past 2 weeks?: Not at all  Thoughts that you would be better off DEAD, or of hurting yourself in some way.: Not at all  If you checked off any problems, how difficult have these problems made it for you to do your work, take care of things at home, or get along with other people?: Not difficult at all  PHQ 9 Total: 0  Interpretation of Total Score: 0-4 No depression      Past Medical History:   Diagnosis Date   . 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 Allergic rhinitis    . Anxiety    . COVID-19 vaccine series declined    . Depression    . Gastroenteritis    . Insomnia disorder          Past Surgical History:   Procedure Laterality Date   . HX CARPAL TUNNEL RELEASE Right    . HX HYSTERECTOMY     .  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 Smoking status: Former     Types: Cigarettes     Quit date: 2016     Years since quitting: 7.4   . Smokeless tobacco: Never   Vaping Use   . Vaping Use: Never used   Substance Use Topics   . Alcohol use: Never   . Drug use: Never     Medication:  cetirizine (ZYRTEC) 10 mg Oral Tablet, Take 1 Tablet (10 mg total) by mouth Once per day as needed for Allergies  EPINEPHrine 0.3 mg/0.3 mL Injection Auto-Injector, Inject 0.3 mL (0.3 mg total) into the muscle Once, as needed  escitalopram oxalate (LEXAPRO) 20 mg Oral Tablet, TAKE 1 TABLET BY MOUTH ONCE A DAY  meloxicam (MOBIC) 15 mg Oral Tablet, TAKE 1 TABLET BY MOUTH EVERY DAY AS NEEDED FOR PAIN WITH  FOOD  multivitamin-iron-folic acid (CENTRUM) 18-400 mg-mcg Oral Tablet, Take 1 Tablet by mouth Once a day  amLODIPine (NORVASC) 2.5 mg Oral Tablet, Take 1 Tablet (2.5 mg total) by mouth Once a day  busPIRone (BUSPAR) 10 mg Oral Tablet, Take 1 Tablet (10 mg total) by mouth Three times a day as needed (Anxiety)    No facility-administered medications prior to visit.    Allergies:  Allergies   Allergen Reactions   . Codeine Hives/ Urticaria   . Diphenhydramine Hcl Hives/ Urticaria   . Penicillins Hives/ Urticaria   . Diphenhydramine Hives/ Urticaria       Physical Exam:  Vitals:    12/17/21 1059   BP: (!) 130/92   Pulse: 80   Temp: 36.9 C (98.4 F)   TempSrc: Tympanic   SpO2: 97%   Weight: 90.7 kg (200 lb)   Height: 1.6 m (5\' 3" )   BMI: 35.5      Physical Exam  Vitals and nursing note reviewed.   Constitutional:       Appearance: Normal appearance.   HENT:      Right Ear: Tympanic membrane normal.      Left Ear: Tympanic membrane normal.      Mouth/Throat:      Mouth: Mucous membranes are moist.      Pharynx: Oropharynx is clear.   Eyes:      Pupils: Pupils are equal, round, and reactive to light.   Cardiovascular:      Rate and Rhythm: Normal rate and regular rhythm.      Pulses: Normal pulses.   Pulmonary:      Effort: Pulmonary effort is normal.      Breath sounds: Normal breath sounds.   Abdominal:      General: Bowel sounds are normal.      Palpations: Abdomen is soft.      Tenderness: There is no abdominal tenderness.   Musculoskeletal:         General: No swelling or tenderness. Normal range of motion.      Cervical back: Normal range of motion and neck supple.   Skin:     General: Skin is warm.  Findings: No lesion or rash.   Neurological:      General: No focal deficit present.      Mental Status: She is alert and oriented to person, place, and time.   Psychiatric:         Mood and Affect: Mood normal.         Behavior: Behavior normal.         Thought Content: Thought content normal.         Judgment:  Judgment normal.         Assessment/Plan:  Problem List Items Addressed This Visit    None       Labs up to date  Start Norvasc 2.5mg  po daily  Encouraged to monitor BP outpt w logs  EKG unremarkable  Pt referred to cardiologist for eval  Will get ct of head  Referred also to neurologist  fiorinol prn headaches given  Increase Lexapro 20 mg po daily  Any acute issues seek ER  F/u pending lab results otherwise as scheduled        Follow up: No follow-ups on file.  Seek medical attention for new or worsening symptoms.  Patient has been seen in this clinic within the last 3 years.     Asami Lambright, PA-C          This note was partially created using MModal Fluency Direct system (voice recognition software) and is inherently subject to errors including those of syntax and "sound-alike" substitutions which may escape proofreading.  In such instances, original meaning may be extrapolated by contextual derivation.

## 2021-12-20 ENCOUNTER — Inpatient Hospital Stay
Admission: RE | Admit: 2021-12-20 | Discharge: 2021-12-20 | Disposition: A | Discharge status: Home / Self Care | Payer: Commercial Managed Care - PPO | Source: Ambulatory Visit | Attending: Physician Assistant | Admitting: Physician Assistant

## 2021-12-20 ENCOUNTER — Other Ambulatory Visit (INDEPENDENT_AMBULATORY_CARE_PROVIDER_SITE_OTHER): Payer: Self-pay | Admitting: Physician Assistant

## 2021-12-20 ENCOUNTER — Other Ambulatory Visit: Payer: Self-pay

## 2021-12-20 DIAGNOSIS — M25562 Pain in left knee: Secondary | ICD-10-CM | POA: Insufficient documentation

## 2021-12-24 ENCOUNTER — Encounter (INDEPENDENT_AMBULATORY_CARE_PROVIDER_SITE_OTHER): Payer: Self-pay | Admitting: Physician Assistant

## 2021-12-25 ENCOUNTER — Encounter (INDEPENDENT_AMBULATORY_CARE_PROVIDER_SITE_OTHER): Payer: Self-pay

## 2021-12-25 DIAGNOSIS — G5621 Lesion of ulnar nerve, right upper limb: Secondary | ICD-10-CM | POA: Insufficient documentation

## 2021-12-25 DIAGNOSIS — E7800 Pure hypercholesterolemia, unspecified: Secondary | ICD-10-CM | POA: Insufficient documentation

## 2021-12-25 DIAGNOSIS — R519 Headache, unspecified: Secondary | ICD-10-CM | POA: Insufficient documentation

## 2021-12-25 DIAGNOSIS — E559 Vitamin D deficiency, unspecified: Secondary | ICD-10-CM | POA: Insufficient documentation

## 2021-12-25 DIAGNOSIS — G43909 Migraine, unspecified, not intractable, without status migrainosus: Secondary | ICD-10-CM | POA: Insufficient documentation

## 2021-12-25 DIAGNOSIS — I7300 Raynaud's syndrome without gangrene: Secondary | ICD-10-CM | POA: Insufficient documentation

## 2022-01-06 ENCOUNTER — Other Ambulatory Visit (INDEPENDENT_AMBULATORY_CARE_PROVIDER_SITE_OTHER): Payer: Self-pay | Admitting: Physician Assistant

## 2022-01-06 MED ORDER — FLUCONAZOLE 150 MG TABLET
150.0000 mg | ORAL_TABLET | Freq: Once | ORAL | 0 refills | Status: AC
Start: 2022-01-06 — End: 2022-01-06

## 2022-01-06 NOTE — Telephone Encounter (Signed)
From: Patterson Hammersmith  To: Eyvonne Mechanic, PA-C  Sent: 12/24/2021 3:16 PM EDT  Subject: Knee    Is there anyway I can get refereed to ronoake instead

## 2022-01-17 ENCOUNTER — Encounter (HOSPITAL_BASED_OUTPATIENT_CLINIC_OR_DEPARTMENT_OTHER): Payer: Self-pay

## 2022-01-17 ENCOUNTER — Emergency Department (HOSPITAL_BASED_OUTPATIENT_CLINIC_OR_DEPARTMENT_OTHER): Payer: Commercial Managed Care - PPO

## 2022-01-17 ENCOUNTER — Other Ambulatory Visit: Payer: Self-pay

## 2022-01-17 ENCOUNTER — Emergency Department
Admission: EM | Admit: 2022-01-17 | Discharge: 2022-01-17 | Disposition: A | Discharge status: Home / Self Care | Payer: Commercial Managed Care - PPO | Attending: Physician Assistant | Admitting: Physician Assistant

## 2022-01-17 DIAGNOSIS — Z87891 Personal history of nicotine dependence: Secondary | ICD-10-CM | POA: Insufficient documentation

## 2022-01-17 DIAGNOSIS — W228XXA Striking against or struck by other objects, initial encounter: Secondary | ICD-10-CM | POA: Insufficient documentation

## 2022-01-17 DIAGNOSIS — M79641 Pain in right hand: Secondary | ICD-10-CM | POA: Insufficient documentation

## 2022-01-17 MED ORDER — ACETAMINOPHEN 325 MG TABLET
650.0000 mg | ORAL_TABLET | ORAL | Status: AC
Start: 2022-01-17 — End: 2022-01-17
  Administered 2022-01-17: 650 mg via ORAL

## 2022-01-17 MED ORDER — ACETAMINOPHEN 325 MG TABLET
ORAL_TABLET | ORAL | Status: AC
Start: 2022-01-17 — End: 2022-01-17
  Filled 2022-01-17: qty 2

## 2022-01-17 NOTE — ED Provider Notes (Signed)
[facility], [facility] Emergency Department  Primary Provider Note      CHIEF COMPLAINT  Chief Complaint   Patient presents with   . Hand Injury     HISTORY OF PRESENT ILLNESS  Stacy Cantrell, date of birth 1980/08/22, is a 41 y.o. female who presented to the Emergency Department with right hand pain after dropping a large candle on it.  She reports she was attempting to blow out the candle when she dropped it. This was around 12 pm.  She has not taken any medication for pain.  She has been applying ice to the area.  She denies numbness and tingling.  She reports she is able to move her fingers despite pain.  No other complaints or concerns.  She is right-hand dominant.    PAST MEDICAL/SURGICAL/FAMILY/SOCIAL HISTORY  Past Medical History:   Diagnosis Date   . 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 Allergic rhinitis    . Anxiety    . COVID-19 vaccine series declined    . Depression    . Gastroenteritis    . Insomnia disorder        Past Surgical History:   Procedure Laterality Date   . HX CARPAL TUNNEL RELEASE Right    . HX HYSTERECTOMY     . 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 Marital status: Married   Tobacco Use   . Smoking status: Former     Types: Cigarettes     Quit date: 2016     Years since quitting: 7.5   . Smokeless tobacco: Never   Vaping Use   . Vaping Use: Never used   Substance and Sexual Activity   . Alcohol use: Never   . Drug use: Never   . Sexual activity: Yes     Partners: Male      ALLERGIES  Allergies   Allergen Reactions   . Codeine Hives/ Urticaria   . Diphenhydramine Hcl Hives/ Urticaria   . Penicillins Hives/ Urticaria   .  Diphenhydramine Hives/ Urticaria       PHYSICAL EXAM  VITAL SIGNS:  Filed Vitals:    01/17/22 1911   BP: (!) 147/97   Pulse: 79   Resp: 14   Temp: 37 C (98.6 F)   SpO2: 100%       General: No distress.   HENT:  Head: Normocephalic and atraumatic.  Mouth/Throat: Oropharynx is clear and moist. Uvula midline.   Eyes: EOMI, PERRL. normal conjunctiva without injection, lids without swelling or exudate  Ears: Hearing grossly intact.   Neck: Trachea midline. Neck supple.  Cardiovascular: RRR.    Respiratory: CTAB, normal work of breathing w/o retractions or accessory muscle accessory muscle use       GU: Deferred  Rectal: Deferred  Musculoskeletal:  Minimal right-hand ecchymosis, full range of motion right hand, pulse motor sensory intact.  Radial pulse 2+ equal bilaterally.  Moves all extremities. Able to ambulate without assistance.  Skin: Warm and dry.   Psychiatric: normal mood  and affect. Behavior is normal.  Neurological: A&O x3, no acute deficit or focal weakness.     DIAGNOSTICS  Labs:  Labs listed below were reviewed and interpreted by me.  No results found for any visits on 01/17/22.  Radiology:  Results for orders placed or performed during the hospital encounter of 01/17/22   XR HAND RIGHT     Status: None    Narrative    Judy L Uncapher    RADIOLOGIST: Wynema Birch, MD    XR HAND RIGHT performed on 01/17/2022 7:44 PM    CLINICAL HISTORY: Dropped a candle on it, pain.  candle dropped on hand, pain    TECHNIQUE:  3 views of the right hand    COMPARISON:  None.    FINDINGS:   No visible fracture.  No suspicious bone lesion.  Normal alignment.  Soft tissues are unremarkable.        Impression    NEGATIVE HAND SERIES           Radiologist location ID: [id number]         ED COURSE/MEDICAL DECISION MAKING  Medications Administered in the ED   acetaminophen (TYLENOL) tablet (650 mg Oral Given 01/17/22 1939)    who presented to the Emergency Department with right hand pain after dropping a large candle on it.   She reports she was attempting to blow out the candle when she dropped it. This was around 12 pm.  She has not taken any medication for pain.  She has been applying ice to the area.  She denies numbness and tingling.  She reports she is able to move her fingers despite pain.  Pt was treated with tylenol, and xrays of the right hand were ordered and noted to be negative.  Patient was placed in a Velcro wrist splint, given close encouraged to follow-up with PCP.  All questions were answered prior to discharge.    ED Course as of 01/17/22 1955   Fri Jan 17, 2022   1949 XR HAND RIGHT  FINDINGS:   No visible fracture.  No suspicious bone lesion.  Normal alignment.  Soft tissues are unremarkable.  IMPRESSION:  NEGATIVE HAND SERIES       Medical Decision Making  Amount and/or Complexity of Data Reviewed  Independent Historian: spouse  Radiology: ordered.      Risk  OTC drugs.        CLINICAL IMPRESSION  Clinical Impression   Right hand pain (Primary)     DISPOSITION  Discharged       DISCHARGE MEDICATIONS  Current Discharge Medication List          [name]   01/17/2022, 19:25   [facility]  Department of Emergency Medicine  [facility]    This note was partially generated using MModal Fluency Direct system, and there may be some incorrect words, spellings, and punctuation that were not noted in checking the note before saving.    -----

## 2022-01-17 NOTE — ED Triage Notes (Signed)
Dropped a glass candle on her right hand today.  Having pain and swelling since.

## 2022-01-17 NOTE — ED Nurses Note (Signed)
Velcro wrist splint placed on patient's right arm per provider verbal order. Patient discharged home with family.  AVS reviewed with patient/care giver.  A written copy of the AVS and discharge instructions was given to the patient/care giver. Scripts handed to patient/care giver. Questions sufficiently answered as needed.  Patient/care giver encouraged to follow up with PCP as indicated.  In the event of an emergency, patient/care giver instructed to call 911 or go to the nearest emergency room.

## 2022-01-17 NOTE — Discharge Instructions (Signed)
Alternate Tylenol and Motrin as directed by your PCP.  Continue to apply ice to the area in 15 minute intervals.  Continue all home medication as directed by your primary care provider. Discuss all medications with your pharmacist to ensure there are no concerns of interactions.  If anything changes, gets worse or does not improve return to the emergency department for re-evaluation.  Call your primary care provider to schedule follow up appointment, they may want to do further testing.

## 2022-01-22 ENCOUNTER — Telehealth (INDEPENDENT_AMBULATORY_CARE_PROVIDER_SITE_OTHER): Payer: Self-pay | Admitting: Physician Assistant

## 2022-01-22 NOTE — Telephone Encounter (Signed)
Patient said her hand is doing better it os just sore. I informed her to give [place] a call if she needs anything.

## 2022-02-27 ENCOUNTER — Ambulatory Visit: Payer: No Typology Code available for payment source | Admitting: Physician Assistant

## 2022-03-06 IMAGING — MR MRI KNEE LT W/O CONTRAST
4 of 5 series · 26 of 40 positions shown · non-contrast
Comparison: None available.

﻿EXAM:  07008   MRI KNEE LT W/O CONTRAST
INDICATION: Left knee pain.
TECHNIQUE: Noncontrast multiplanar, multisequence MRI was performed.

[Series 5: PD fat-sat · axial · left · 4.0mm · 0.53mm/px · z∈[-69,+61]mm · 8 of 30 slices shown (1 of 3)]
[im 1/30]
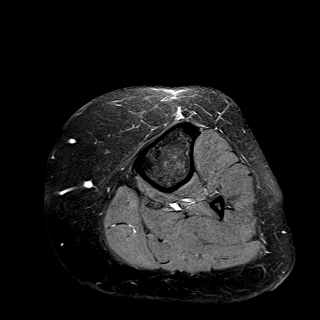
[im 5/30]
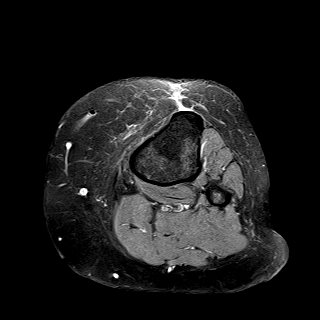
[im 9/30]
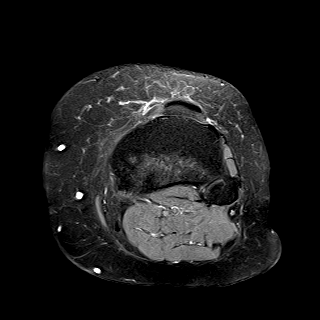
[im 13/30]
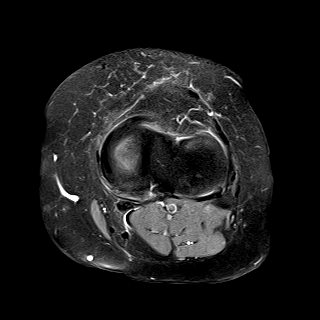
[im 17/30]
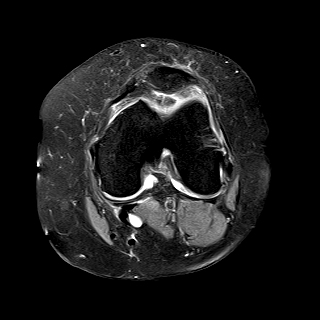
[im 21/30]
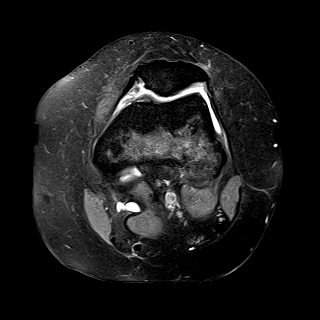
[im 25/30]
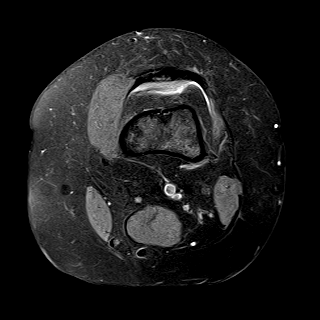
[im 30/30]
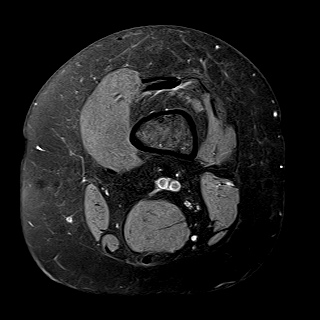

[Series 6: PD fat-sat · sagittal · left · 3.0mm · 0.29mm/px · 8 of 30 slices shown (2 of 3)]
[im 1/30]
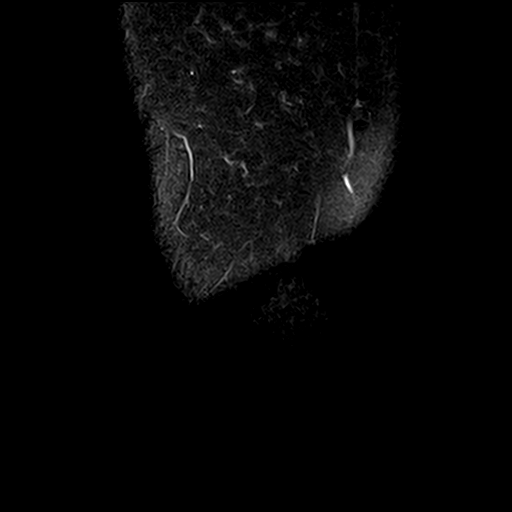
[im 5/30]
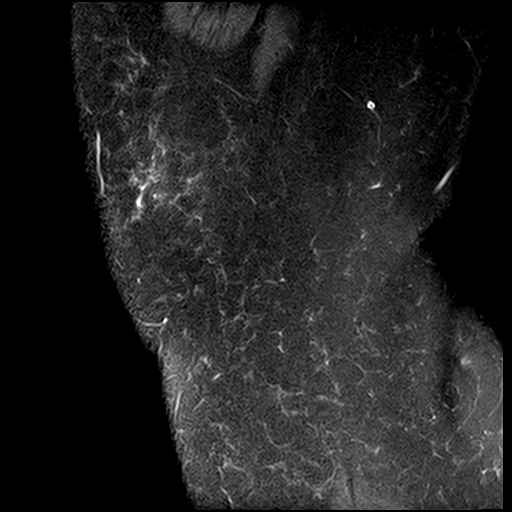
[im 9/30]
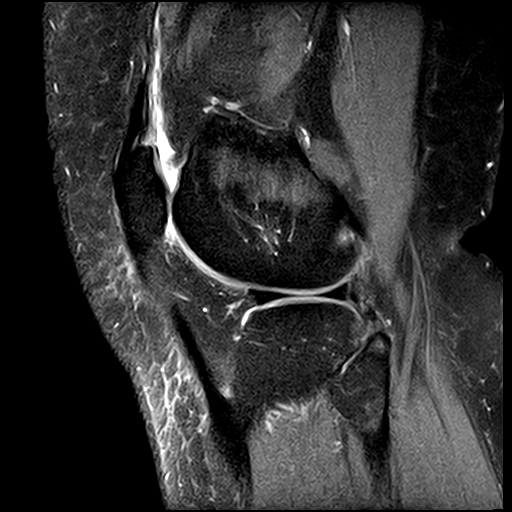
[im 13/30]
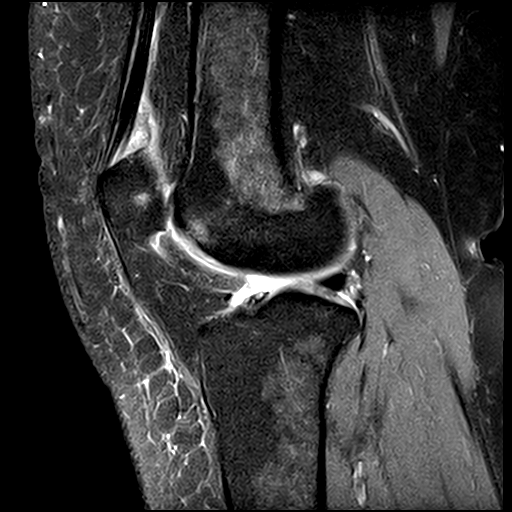
[im 17/30]
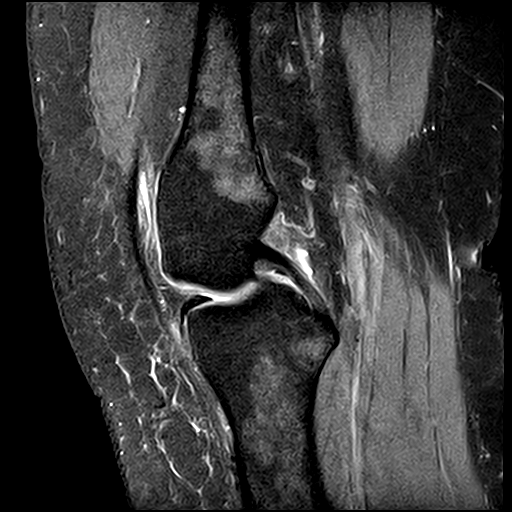
[im 21/30]
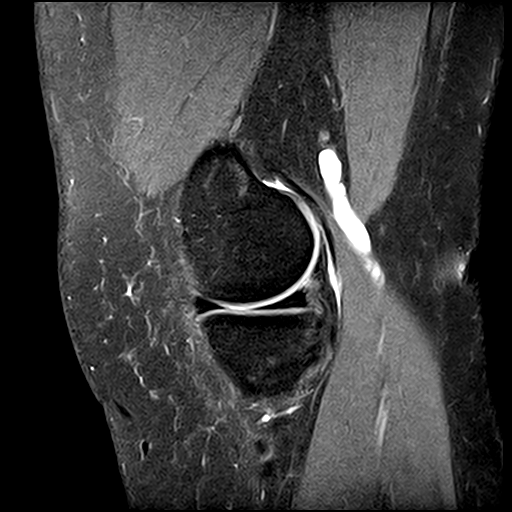
[im 25/30]
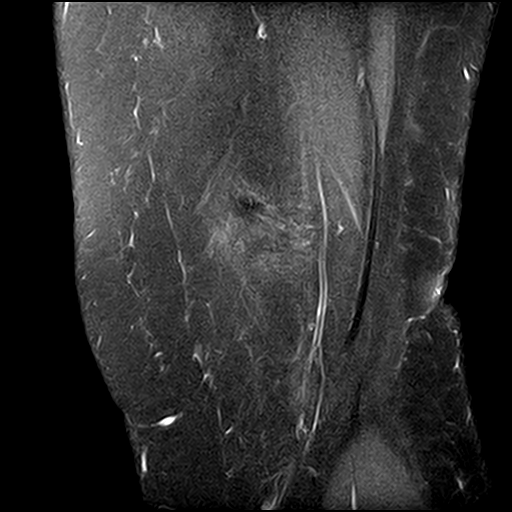
[im 30/30]
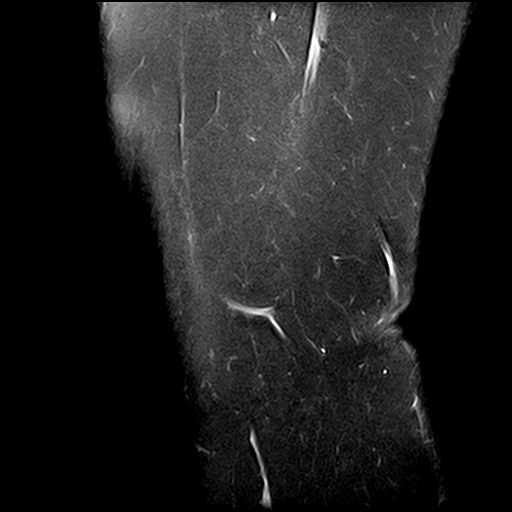

[Series 7: T1 · sagittal · left · 3.0mm · 0.29mm/px · 3 of 30 slices shown]
[im 5/30]
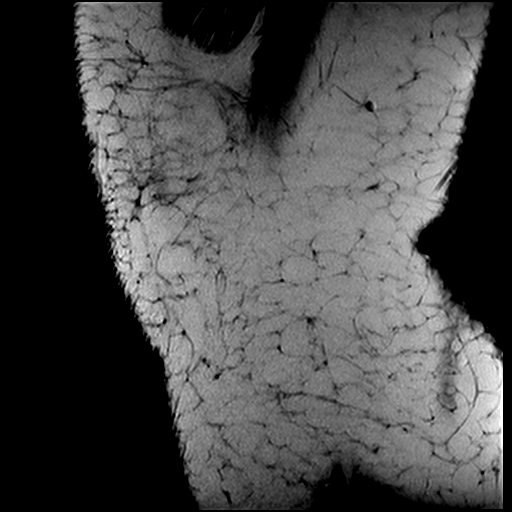
[im 17/30]
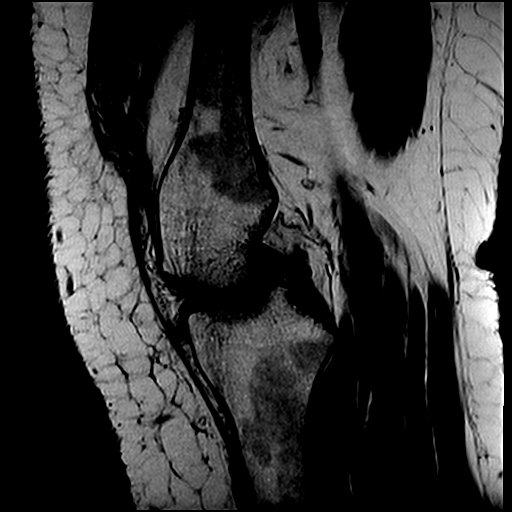
[im 25/30]
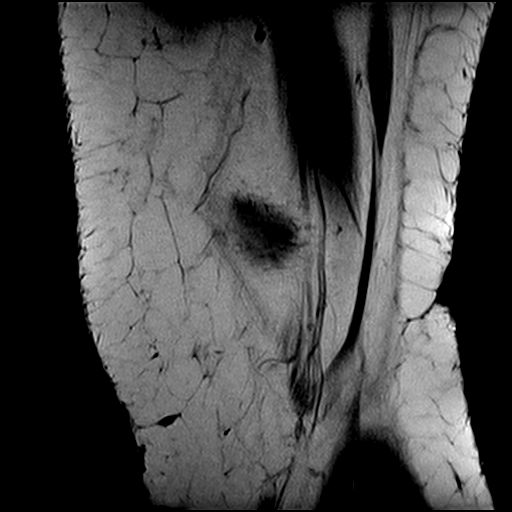

[Series 9: PD fat-sat · coronal · left · 3.0mm · 0.33mm/px · 7 of 27 slices shown (3 of 3)]
[im 1/27]
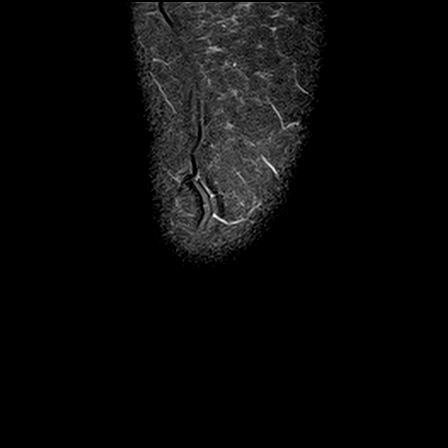
[im 4/27]
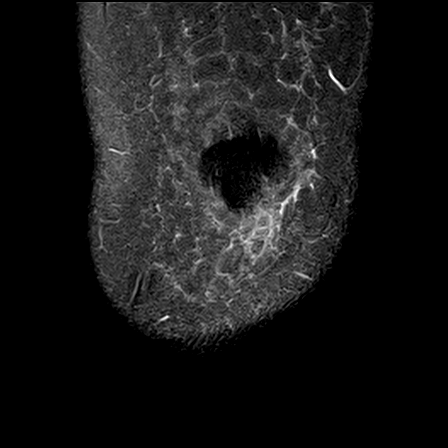
[im 8/27]
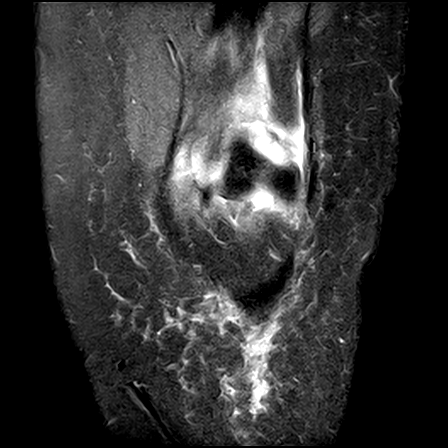
[im 12/27]
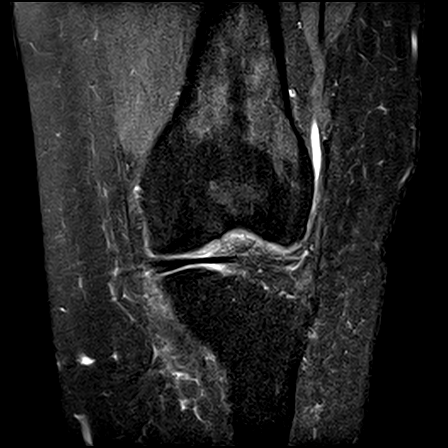
[im 15/27]
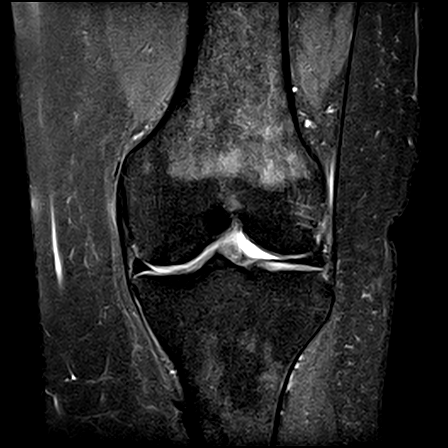
[im 19/27]
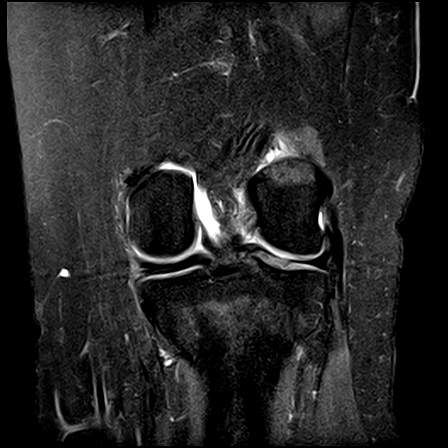
[im 23/27]
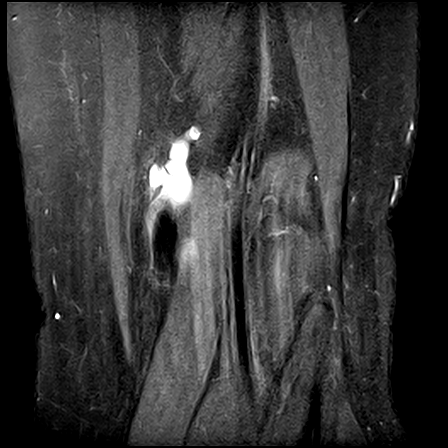

[26 of 40 positions shown; findings below may reference images not displayed]

FINDINGS: There is a small joint effusion.  

There are two small foci of osteochondritis dissecans, one involving the anterior aspect of the medial femoral condyle near the intercondylar notch and the other involving the anterior aspect of the lateral femoral condyle near the intercondylar notch.  The overlying articular cartilage is not depressed.  

There are mild degenerative changes.  There is no fracture or dislocation.  

The menisci, cruciate ligaments, collateral ligaments, and extensor tendons appear intact.
IMPRESSION: 1. Osteochondritis dissecans.  

2. No internal derangement is seen.

## 2022-03-17 ENCOUNTER — Ambulatory Visit (INDEPENDENT_AMBULATORY_CARE_PROVIDER_SITE_OTHER): Payer: No Typology Code available for payment source | Admitting: Physician Assistant

## 2022-03-19 ENCOUNTER — Encounter (INDEPENDENT_AMBULATORY_CARE_PROVIDER_SITE_OTHER): Payer: Self-pay | Admitting: Physician Assistant

## 2022-03-31 ENCOUNTER — Ambulatory Visit: Payer: Commercial Managed Care - PPO | Attending: Physician Assistant | Admitting: Physician Assistant

## 2022-03-31 ENCOUNTER — Encounter (INDEPENDENT_AMBULATORY_CARE_PROVIDER_SITE_OTHER): Payer: Self-pay | Admitting: Physician Assistant

## 2022-03-31 ENCOUNTER — Other Ambulatory Visit: Payer: Self-pay

## 2022-03-31 VITALS — BP 124/80 | HR 81 | Temp 98.4°F | Ht 63.0 in | Wt 205.0 lb

## 2022-03-31 DIAGNOSIS — M2550 Pain in unspecified joint: Secondary | ICD-10-CM | POA: Insufficient documentation

## 2022-03-31 DIAGNOSIS — R5382 Chronic fatigue, unspecified: Secondary | ICD-10-CM | POA: Insufficient documentation

## 2022-03-31 DIAGNOSIS — I7300 Raynaud's syndrome without gangrene: Secondary | ICD-10-CM

## 2022-03-31 DIAGNOSIS — F419 Anxiety disorder, unspecified: Secondary | ICD-10-CM

## 2022-03-31 DIAGNOSIS — F32A Depression, unspecified: Secondary | ICD-10-CM

## 2022-03-31 DIAGNOSIS — I10 Essential (primary) hypertension: Secondary | ICD-10-CM

## 2022-03-31 DIAGNOSIS — M2560 Stiffness of unspecified joint, not elsewhere classified: Secondary | ICD-10-CM | POA: Insufficient documentation

## 2022-03-31 DIAGNOSIS — R5383 Other fatigue: Secondary | ICD-10-CM | POA: Insufficient documentation

## 2022-03-31 DIAGNOSIS — E559 Vitamin D deficiency, unspecified: Secondary | ICD-10-CM

## 2022-03-31 MED ORDER — CYANOCOBALAMIN (VIT B-12) 1,000 MCG/ML INJECTION SOLUTION
1000.0000 ug | INTRAMUSCULAR | 0 refills | Status: DC
Start: 2022-03-31 — End: 2023-01-05

## 2022-03-31 MED ORDER — CYANOCOBALAMIN (VIT B-12) 1,000 MCG/ML INJECTION SOLUTION
1000.0000 ug | Freq: Once | INTRAMUSCULAR | Status: AC
Start: 2022-03-31 — End: 2022-03-31
  Administered 2022-03-31: 1000 ug via INTRAMUSCULAR

## 2022-03-31 NOTE — Progress Notes (Incomplete)
INTERNAL MEDICINE, BUILDING A  [address]  [place] [address]  Operated by [facility]  History and Physical     Name: Stacy Cantrell MRN:  [id number]   Date: 03/31/2022 Age: 41 y.o.               Follow Up        Reason for Visit: Follow Up 3 Months (Routine ) joint pain w stiffness    History of Present Illness  Stacy Cantrell is a 41 y.o. female who is being seen today for f/u however complaints   Follow-up blood pressure/hypertension with recent started Norvasc 2.5 mg daily.  It was noted that her blood pressure was elevated visit and she also had symptoms of Raynaud's started on Norvasc 2.5 mg daily.  Patient was also experiencing headaches as well as nosebleeds possibly related to her blood pressure.  Fiorinal was  prescribed for the headaches along with blood pressure control and patient states there significantly better.  She is still having some nosebleeds off and on but no where near as frequent as previously noted.  Patient states she has a follow-up with the neurologist and [place] scheduled concerning her headaches.  She did refuse CT scan at this current time.  Has not followed with a cardiologist also states she has a return appointment to lab 24th for nuclear stress test in on August 1st echocardiogram.  She still has complaints of fatigue noted and would like B12 shot today which has helped her some in the past.    Follow-up chronic anxiety/depression with patient currently taking Lexapro 20 mg daily along with BuSpar as needed.  Med recently increased after reports of increased stress and anxiety reported. Pt states dose adjustment has seemed to help her and overall she is feeling better.   At this time denies panic attacks suicidal thoughts ideation    Follow-up cholesterol/lipid with patient currently watching diet.  She is not on statin therapy but last levels shows elevation in her cholesterol ratio.  Follow-up labs needed today.    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  She is she has a follow-up with Dr. Sharol Roussel concerning issues coming up.    PHQ Questionnaire  Little interest or pleasure in doing things.: Not at all  Feeling down, depressed, or hopeless: Not at all  PHQ 2 Total: 0  Trouble falling or staying asleep, or sleeping too much.: Not at all  Feeling tired or having little energy: Not at all  Poor appetite or overeating: Not at all  Feeling bad about yourself/ that you are a failure in the past 2 weeks?: Not at all  Trouble concentrating on things in the past 2 weeks?: Not at all  Moving/Speaking slowly or being fidgety or restless  in the past 2 weeks?: Not at all  Thoughts that you would be better off DEAD, or of hurting yourself in some way.: Not at all  PHQ 9 Total: 0      Past Medical History:   Diagnosis Date   . 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 Allergic rhinitis    . Anxiety    . COVID-19 vaccine series declined    . Depression    . Gastroenteritis    . Insomnia disorder          Past Surgical History:   Procedure Laterality Date   .  HX CARPAL TUNNEL RELEASE Right    . HX HYSTERECTOMY     . 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 Smoking status: Former     Packs/day: 0.50     Years: 5.00     Additional pack years: 0.00     Total pack years: 2.50     Types: Cigarettes     Quit date: 06/30/2014     Years since quitting: 7.7   . Smokeless tobacco: Never   Vaping Use   . Vaping Use: Never used   Substance Use Topics   . Alcohol use: Not Currently     Alcohol/week: 1.0 standard drink of alcohol     Types: 1 Glasses of wine per week   . Drug  use: Never     Medication:  amLODIPine (NORVASC) 5 mg Oral Tablet, Take 1 Tablet (5 mg total) by mouth Once a day  cetirizine (ZYRTEC) 10 mg Oral Tablet, Take 1 Tablet (10 mg total) by mouth Once per day as needed for Allergies  EPINEPHrine 0.3 mg/0.3 mL Injection Auto-Injector, Inject 0.3 mL (0.3 mg total) into the muscle Once, as needed  escitalopram oxalate (LEXAPRO) 20 mg Oral Tablet, TAKE 1 TABLET BY MOUTH ONCE A DAY  meloxicam (MOBIC) 15 mg Oral Tablet, TAKE 1 TABLET BY MOUTH EVERY DAY AS NEEDED FOR PAIN WITH FOOD  multivitamin-iron-folic acid (CENTRUM) 123XX123 mg-mcg Oral Tablet, Take 1 Tablet by mouth Once a day    No facility-administered medications prior to visit.    Allergies:  Allergies   Allergen Reactions   . Codeine Hives/ Urticaria   . Diphenhydramine Hcl Hives/ Urticaria   . Penicillins Hives/ Urticaria   . Diphenhydramine Hives/ Urticaria       Physical Exam:  Vitals:    03/31/22 1455   BP: 124/80   Pulse: 81   Temp: 36.9 C (98.4 F)   TempSrc: Tympanic   SpO2: 95%   Weight: 93 kg (205 lb)   Height: 1.6 m (5\' 3" )   BMI: 36.39      Physical Exam  Vitals and nursing note reviewed.   Constitutional:       Appearance: Normal appearance.   HENT:      Right Ear: Tympanic membrane normal.      Left Ear: Tympanic membrane normal.      Mouth/Throat:      Mouth: Mucous membranes are moist.      Pharynx: Oropharynx is clear.   Eyes:      Pupils: Pupils are equal, round, and reactive to light.   Cardiovascular:      Rate and Rhythm: Normal rate and regular rhythm.      Pulses: Normal pulses.   Pulmonary:      Effort: Pulmonary effort is normal.      Breath sounds: Normal breath sounds.   Abdominal:      General: Bowel sounds are normal.      Palpations: Abdomen is soft.      Tenderness: There is no abdominal tenderness.  Musculoskeletal:         General: No swelling or tenderness. Normal range of motion.      Cervical back: Normal range of motion and neck supple.      Comments: Pain noted with range of  motion left knee however intact  Negative anterior drawer sign noted  Negative McMurray's  Crepitus on range of motion noted  Gait stable   Skin:     General: Skin is warm.      Findings: No lesion or rash.   Neurological:      General: No focal deficit present.      Mental Status: She is alert and oriented to person, place, and time.   Psychiatric:         Mood and Affect: Mood normal.         Behavior: Behavior normal.         Thought Content: Thought content normal.         Judgment: Judgment normal.       Assessment/Plan:  Problem List Items Addressed This Visit    None         Labs obtained in office  X-ray left knee ordered  Voltaren gel topically b.i.d. as directed  B12 shot given  Increase  Norvasc 5mg  po daily  continue to monitor BP outpt w logs  Continue f/u w  cardiologist as scheduled  Continue f/u w  Neurologist as scheduled  fiorinol prn headaches given  F/u pending lab results otherwise as scheduled        Follow up: No follow-ups on file.  Seek medical attention for new or worsening symptoms.  Patient has been seen in this clinic within the last 3 years.     Arhaan Chesnut, PA-C          This note was partially created using MModal Fluency Direct system (voice recognition software) and is inherently subject to errors including those of syntax and "sound-alike" substitutions which may escape proofreading.  In such instances, original meaning may be extrapolated by contextual derivation.

## 2022-03-31 NOTE — Nursing Note (Unsigned)
Patient is here for routine 3 month follow up for medication refills. Patient refused flu vaccine at this time.

## 2022-04-01 LAB — CBC WITH DIFF
BASOPHIL #: 0.1 10*3/uL (ref 0.00–0.10)
BASOPHIL %: 1 % (ref 0–1)
EOSINOPHIL #: 0.1 10*3/uL (ref 0.00–0.50)
EOSINOPHIL %: 1 %
HCT: 40.3 % (ref 31.2–41.9)
HGB: 13.8 g/dL (ref 10.9–14.3)
LYMPHOCYTE #: 2.3 10*3/uL (ref 1.00–3.00)
LYMPHOCYTE %: 25 % (ref 16–44)
MCH: 29.2 pg (ref 24.7–32.8)
MCHC: 34.2 g/dL (ref 32.3–35.6)
MCV: 85.4 fL (ref 75.5–95.3)
MONOCYTE #: 0.6 10*3/uL (ref 0.30–1.00)
MONOCYTE %: 7 % (ref 5–13)
MPV: 7.8 fL — ABNORMAL LOW (ref 7.9–10.8)
NEUTROPHIL #: 6 10*3/uL (ref 1.85–7.80)
NEUTROPHIL %: 65 % (ref 43–77)
PLATELETS: 293 10*3/uL (ref 140–440)
RBC: 4.72 10*6/uL (ref 3.63–4.92)
RDW: 12.7 % (ref 12.3–17.7)
WBC: 9.1 10*3/uL (ref 3.8–11.8)

## 2022-04-01 LAB — SEDIMENTATION RATE: ERYTHROCYTE SEDIMENTATION RATE (ESR): 9 mm/hr (ref <20)

## 2022-04-01 LAB — IRON TRANSFERRIN AND TIBC
IRON (TRANSFERRIN) SATURATION: 15 % (ref 15–50)
IRON: 49 ug/dL — ABNORMAL LOW (ref 50–212)
TOTAL IRON BINDING CAPACITY: 335 ug/dL (ref 250–450)
TRANSFERRIN: 239 mg/dL (ref 203–362)
UIBC: 286 ug/dL (ref 130–375)

## 2022-04-01 LAB — COMPREHENSIVE METABOLIC PNL, FASTING
ALBUMIN/GLOBULIN RATIO: 1.5 — ABNORMAL HIGH (ref 0.8–1.4)
ALBUMIN: 4.4 g/dL (ref 3.5–5.7)
ALKALINE PHOSPHATASE: 51 U/L (ref 34–104)
ALT (SGPT): 17 U/L (ref 7–52)
ANION GAP: 4 mmol/L (ref 4–13)
AST (SGOT): 19 U/L (ref 13–39)
BILIRUBIN TOTAL: 0.3 mg/dL (ref 0.3–1.2)
BUN/CREA RATIO: 12 (ref 6–22)
BUN: 8 mg/dL (ref 7–25)
CALCIUM, CORRECTED: 8.5 mg/dL — ABNORMAL LOW (ref 8.9–10.8)
CALCIUM: 8.9 mg/dL (ref 8.6–10.3)
CHLORIDE: 107 mmol/L (ref 98–107)
CO2 TOTAL: 28 mmol/L (ref 21–31)
CREATININE: 0.69 mg/dL (ref 0.60–1.30)
ESTIMATED GFR: 112 mL/min/{1.73_m2} (ref >59)
GLOBULIN: 2.9 (ref 2.9–5.4)
GLUCOSE: 90 mg/dL (ref 74–109)
OSMOLALITY, CALCULATED: 275 mOsm/kg (ref 270–290)
POTASSIUM: 4.1 mmol/L (ref 3.5–5.1)
PROTEIN TOTAL: 7.3 g/dL (ref 6.4–8.9)
SODIUM: 139 mmol/L (ref 136–145)

## 2022-04-01 LAB — URIC ACID: URIC ACID: 4 mg/dL (ref 2.3–7.6)

## 2022-04-01 LAB — MAGNESIUM: MAGNESIUM: 2.1 mg/dL (ref 1.9–2.7)

## 2022-04-02 LAB — HEP-2 SUBSTRATE ANTINUCLEAR ANTIBODIES (ANA), SERUM: ANA INTERPRETATION: NEGATIVE

## 2022-04-02 LAB — RHEUMATOID FACTOR, SERUM: RHEUMATOID FACTOR: <13 IU/mL (ref <=30)

## 2022-04-02 LAB — LYME ANTIBODY PANEL WITH REFLEX: LYME ANTIBODY TOTAL (Screen): NEGATIVE

## 2022-04-04 ENCOUNTER — Encounter (INDEPENDENT_AMBULATORY_CARE_PROVIDER_SITE_OTHER): Payer: Self-pay | Admitting: Physician Assistant

## 2022-04-07 NOTE — Telephone Encounter (Signed)
-----   Message from Cleophus Molt, [place] sent at 04/02/2022  1:58 PM EDT -----  Ca little low make sure on MVI w ca  Labs otherwise look good

## 2022-04-07 NOTE — Telephone Encounter (Signed)
Patient informed. 

## 2022-04-10 ENCOUNTER — Encounter (INDEPENDENT_AMBULATORY_CARE_PROVIDER_SITE_OTHER): Payer: Self-pay | Admitting: Physician Assistant

## 2022-04-10 ENCOUNTER — Telehealth (INDEPENDENT_AMBULATORY_CARE_PROVIDER_SITE_OTHER): Payer: Self-pay | Admitting: Physician Assistant

## 2022-04-10 MED ORDER — SULFAMETHOXAZOLE 800 MG-TRIMETHOPRIM 160 MG TABLET
1.0000 | ORAL_TABLET | Freq: Two times a day (BID) | ORAL | 0 refills | Status: AC
Start: 2022-04-10 — End: 2022-04-20

## 2022-04-10 NOTE — Telephone Encounter (Signed)
Patient called and stated that she thinks she has a UTI and was wanting an antibiotic for it. I asked if she could come give a urine sample and she stated that she had to work today and tomorrow. Can we send an antibiotic in for her?

## 2022-04-21 ENCOUNTER — Other Ambulatory Visit (INDEPENDENT_AMBULATORY_CARE_PROVIDER_SITE_OTHER): Payer: Self-pay | Admitting: Physician Assistant

## 2022-04-21 DIAGNOSIS — R519 Headache, unspecified: Secondary | ICD-10-CM

## 2022-04-27 DIAGNOSIS — I10 Essential (primary) hypertension: Secondary | ICD-10-CM | POA: Insufficient documentation

## 2022-05-05 MED ORDER — TOPIRAMATE 25 MG TABLET
25.0000 mg | ORAL_TABLET | Freq: Every day | ORAL | 1 refills | Status: DC
Start: 2022-05-05 — End: 2022-06-02

## 2022-05-18 ENCOUNTER — Encounter (INDEPENDENT_AMBULATORY_CARE_PROVIDER_SITE_OTHER): Payer: Self-pay | Admitting: Physician Assistant

## 2022-05-27 ENCOUNTER — Encounter (INDEPENDENT_AMBULATORY_CARE_PROVIDER_SITE_OTHER): Payer: Self-pay | Admitting: NEUROLOGY

## 2022-06-02 ENCOUNTER — Other Ambulatory Visit (INDEPENDENT_AMBULATORY_CARE_PROVIDER_SITE_OTHER): Payer: Self-pay | Admitting: Physician Assistant

## 2022-06-02 MED ORDER — TOPIRAMATE 25 MG TABLET
25.0000 mg | ORAL_TABLET | Freq: Every day | ORAL | 1 refills | Status: DC
Start: 2022-06-02 — End: 2022-06-16

## 2022-06-06 ENCOUNTER — Ambulatory Visit (INDEPENDENT_AMBULATORY_CARE_PROVIDER_SITE_OTHER): Payer: Commercial Managed Care - PPO | Admitting: NEUROLOGY

## 2022-06-06 NOTE — Progress Notes (Deleted)
ASSESSMENT  1.  2.    PLAN  1.  2.    Thank you for allowing me to participate in your patient's care and please do not hesitate to contact me for any questions or concerns.    Adrian Prows, DO  Assistant Professor of Neurology  [facility]       ==========================================================================================================================================    NAME:  Stacy Cantrell  DOB:  [birth year]  VISIT DATE:  06/06/2022    CC:  Headache     Patient seen in consultation at the request of Amy Alvis, PA-C  History obtained from the patient and chart/records  Age of patient:  41 y.o.      HPI:   I had the pleasure of seeing your patient in neurology clinic for an outpatient consultation, who is a 41 y.o. year old female who was referred for evaluation of headache.  Please allow me to summarize the history for the record.  ***    ============================================================================================================================================  PMHx  Patient Active Problem List   Diagnosis    Anxiety    Depression    Allergic rhinitis    Chronic fatigue    Dyspareunia, female    Insomnia    Pelvic pain    Seasonal allergies    Stress incontinence of urine    Morton's neuroma of both feet    Tingling in extremities    Varicose veins of bilateral lower extremities with pain    Elevated cholesterol    Vitamin D deficiency    Raynaud's phenomenon    Ulnar nerve entrapment at elbow, right    Headache    Hypertension     Past Surgical History:   Procedure Laterality Date    HX CARPAL TUNNEL RELEASE Right     HX HYSTERECTOMY      VASCULAR SURGERY Right     vein stripped from right leg         Family Medical History:       Problem Relation (Age of Onset)    Breast Cancer Other    Cervical Cancer Other    Diabetes type II Maternal Grandmother    Elevated Lipids Father    Hypertension (High Blood Pressure) Father, Paternal Grandmother             Current Outpatient Medications   Medication Sig Dispense Refill    amLODIPine (NORVASC) 5 mg Oral Tablet Take 1 Tablet (5 mg total) by mouth Once a day 90 Tablet 1    cetirizine (ZYRTEC) 10 mg Oral Tablet Take 1 Tablet (10 mg total) by mouth Once per day as needed for Allergies      cyanocobalamin (VITAMIN B12) 1,000 mcg/mL Injection Solution Inject 1 mL (1,000 mcg total) under the skin Every 30 days 10 mL 0    EPINEPHrine 0.3 mg/0.3 mL Injection Auto-Injector Inject 0.3 mL (0.3 mg total) into the muscle Once, as needed      escitalopram oxalate (LEXAPRO) 20 mg Oral Tablet TAKE 1 TABLET BY MOUTH ONCE A DAY 30 Tablet 1    meloxicam (MOBIC) 15 mg Oral Tablet TAKE 1 TABLET BY MOUTH EVERY DAY AS NEEDED FOR PAIN WITH FOOD      multivitamin-iron-folic acid (CENTRUM) 17-793 mg-mcg Oral Tablet Take 1 Tablet by mouth Once a day      topiramate (TOPAMAX) 25 mg Oral Tablet Take 1 Tablet (25 mg total) by mouth Once a day 90 Tablet 1     No current facility-administered  medications for this visit.     Allergies   Allergen Reactions    Codeine Hives/ Urticaria    Diphenhydramine Hcl Hives/ Urticaria    Penicillins Hives/ Urticaria    Diphenhydramine Hives/ Urticaria     Social History     Socioeconomic History    Marital status: Married     Spouse name: Not on file    Number of children: Not on file    Years of education: Not on file    Highest education level: Not on file   Occupational History    Not on file   Tobacco Use    Smoking status: Former     Packs/day: 0.50     Years: 5.00     Additional pack years: 0.00     Total pack years: 2.50     Types: Cigarettes     Quit date: 06/30/2014     Years since quitting: 7.9    Smokeless tobacco: Never   Vaping Use    Vaping Use: Never used   Substance and Sexual Activity    Alcohol use: Not Currently     Alcohol/week: 1.0 standard drink of alcohol     Types: 1 Glasses of wine per week    Drug use: Never    Sexual activity: Yes     Partners: Male   Other Topics Concern    Not  on file   Social History Narrative    Not on file     Social Determinants of Health     Financial Resource Strain: Not on file   Transportation Needs: Not on file   Social Connections: Not on file   Intimate Partner Violence: Not on file   Housing Stability: Not on file       ============================================================================================================================================  GENERAL EXAMINATION  There were no vitals taken for this visit.      Vital signs personally reviewed  General: No acute distress, alert  HEENT: Normocephalic, no scleral icterus  Pulmonary: No accessory muscle use, no tachypnea  Cardiovascular: Heart with regular rate & rhythm  Extremities: No significant edema, No cyanosis    NEUROLOGIC EXAM  On neurological exam, patient was awake, alert and answering questions appropriately  Speech was fluent, without dysarthria or aphasia.    CN  II: not directly tested, grossly intact  III, IV, VI: extraocular movements intact without nystagmus  V: intact to light touch  VII: face symmetric without weakness  VIII: grossly intact  IX, X: symmetric palatal elevation  XI: normal strength of trapezius and sternocleidomastoid bilaterally  XII: tongue midline with full movements    MOTOR  Bulk: normal  Tone: normal  Abnormal Movements: none  Fasciculations: none    Strength: Formal strength testing was deferred. Patient moving all extremities symmetrically and against gravity***    MRC Grading Scale   Right Left   Deltoid *** ***   Biceps *** ***   Triceps *** ***   Wrist Extension *** ***   Wrist Flexion - -   Finger Extension *** ***   Finger Abduction *** ***   Finger Flexion *** ***   Hip Flexion *** ***   Hip Extension - -   Hip Abduction - -   Hip Adduction - -   Knee Extension *** ***   Knee Flexion *** ***   Ankle Dorsiflexion *** ***   Ankle Plantarflexion *** ***   Toe Extension *** ***   Toe Flexion - -     REFLEXES  Right Left   Biceps *** ***   Triceps ***  ***   Brachioradialis *** ***   Patellar *** ***   Achilles *** ***   Plantar *** ***   Hoffman *** ***   Pectoralis - -   Jaw Jerk - -       SENSORY  Light touch: ***  Pin: ***  Vibration: ***  Proprioception: ***    GAIT  General: casual, normal gait  Toe Walk: normal  Heel Walk: normal  Tandem: normal    COORDINATION  Finger nose finger: normal  Heel to shin: normal    ================================================================================================================================LABS  Personal Review of prior labs is notable for:   2023  ANA negative   Lyme antibody negative   ESR normal   Rheumatoid factor normal   CMP largely within normal limits  CBC largely within normal limits   B12 greater than 1500  TSH within normal limits  IMAGING  Personal Review of imaging is notable for: no pertinent neurologic imaging for review  OTHER DIAGNOSTICS  Personal Review of other prior diagnostics is notable for:  Not applicable

## 2022-06-16 ENCOUNTER — Encounter (INDEPENDENT_AMBULATORY_CARE_PROVIDER_SITE_OTHER): Payer: Self-pay | Admitting: Physician Assistant

## 2022-06-16 ENCOUNTER — Ambulatory Visit: Payer: Commercial Managed Care - PPO | Attending: Physician Assistant | Admitting: Physician Assistant

## 2022-06-16 ENCOUNTER — Other Ambulatory Visit: Payer: Self-pay

## 2022-06-16 VITALS — BP 122/82 | HR 76 | Temp 97.5°F | Ht 63.0 in | Wt 214.0 lb

## 2022-06-16 DIAGNOSIS — E669 Obesity, unspecified: Secondary | ICD-10-CM | POA: Insufficient documentation

## 2022-06-16 DIAGNOSIS — F419 Anxiety disorder, unspecified: Secondary | ICD-10-CM | POA: Insufficient documentation

## 2022-06-16 DIAGNOSIS — G43809 Other migraine, not intractable, without status migrainosus: Secondary | ICD-10-CM | POA: Insufficient documentation

## 2022-06-16 DIAGNOSIS — I10 Essential (primary) hypertension: Secondary | ICD-10-CM | POA: Insufficient documentation

## 2022-06-16 DIAGNOSIS — F32A Depression, unspecified: Secondary | ICD-10-CM | POA: Insufficient documentation

## 2022-06-16 DIAGNOSIS — R635 Abnormal weight gain: Secondary | ICD-10-CM | POA: Insufficient documentation

## 2022-06-16 MED ORDER — TOPIRAMATE 25 MG TABLET
25.0000 mg | ORAL_TABLET | Freq: Every day | ORAL | 1 refills | Status: DC
Start: 2022-06-16 — End: 2022-12-16

## 2022-06-16 MED ORDER — MOUNJARO 2.5 MG/0.5 ML SUBCUTANEOUS PEN INJECTOR
2.5000 mg | PEN_INJECTOR | SUBCUTANEOUS | 0 refills | Status: DC
Start: 2022-06-16 — End: 2022-06-17

## 2022-06-16 MED ORDER — AMLODIPINE 5 MG TABLET
5.0000 mg | ORAL_TABLET | Freq: Every day | ORAL | 1 refills | Status: DC
Start: 2022-06-16 — End: 2022-09-15

## 2022-06-16 MED ORDER — ESCITALOPRAM 20 MG TABLET
20.0000 mg | ORAL_TABLET | Freq: Every day | ORAL | 1 refills | Status: DC
Start: 2022-06-16 — End: 2022-10-13

## 2022-06-16 NOTE — Progress Notes (Signed)
INTERNAL MEDICINE, BUILDING A  [address]  [place] [address]  Operated by [facility]  History and Physical     Name: Stacy Cantrell MRN:  [id number]   Date: 06/16/2022 Age: 41 y.o.               Follow Up        Reason for Visit: Follow Up 3 Months (Routine ), Weight Gain, and Medication Refill     History of Present Illness  Stacy Cantrell is a 41 y.o. female who is being seen today for f/u however complains of weight gain.  It is noted that she is up 10 lb since previous visit in October.  She states her fatigue is increase most likely due to the weight gain.  Interest in weight loss medicine today.  Patient does work 12+ hours 4-5 days weekly feet all day so feels like she is pretty active.  She also tries to monitor her diet but states it has been very hard for her to lose any kind of weight.  Currently her weight is 214 with a BMI 37.91.    Follow-up blood pressure/hypertension with recent start of Norvasc 5 mg daily.  Blood pressure today is stable at 122/82.  She also has  Raynaud's which the Norvasc has helped.  Currently at this time she denies any chest pain headaches dizziness.   She is also being followed with a cardiologist and was scheduled for nuclear stress test and echocardiogram.    Follow-up chronic anxiety/depression with patient currently taking Lexapro 20 mg daily along with BuSpar as needed.  At this time anxiety is stable and patient overall is doing well concerning her anxiety and depression.  She denies panic attacks suicidal thoughts ideation.    Follow-up chronic h eadaches /migraines for which she is now taking Topamax 25 mg daily.  She states medication does help and headaches have been less.  Denies any dizziness syncope.    Patient also has a history of chronic numbness tingling right hand with ulnar nerve entrapment previously noted.  She recently underwent surgery by Dr. Alessandra Bevels to release ulnar nerve in the right elbow.  Patient did well with  surgery and has a follow-up scheduled.  She is also seeing Dr Alford Highland in [facility] ortho for her knees.    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     Allergic rhinitis     Anxiety     COVID-19 vaccine series declined     Depression     Gastroenteritis     Headache     Insomnia disorder          Past Surgical History:   Procedure Laterality Date    HX CARPAL TUNNEL RELEASE Right     HX HYSTERECTOMY      VASCULAR SURGERY Right     vein stripped from right leg      Family Medical History:       Problem Relation (Age of Onset)    Breast Cancer Other    Cervical Cancer Other    Diabetes type II Maternal Grandmother    Elevated Lipids Father    Hypertension (High Blood Pressure) Father, Paternal Grandmother            Social History     Tobacco Use  Smoking status: Former     Packs/day: 0.50     Years: 5.00     Additional pack years: 0.00     Total pack years: 2.50     Types: Cigarettes     Quit date: 06/30/2014     Years since quitting: 7.9    Smokeless tobacco: Never   Vaping Use    Vaping Use: Never used   Substance Use Topics    Alcohol use: Not Currently     Alcohol/week: 1.0 standard drink of alcohol     Types: 1 Glasses of wine per week    Drug use: Never     Medication:  cetirizine (ZYRTEC) 10 mg Oral Tablet, Take 1 Tablet (10 mg total) by mouth Once per day as needed for Allergies  cyanocobalamin (VITAMIN B12) 1,000 mcg/mL Injection Solution, Inject 1 mL (1,000 mcg total) under the skin Every 30 days  EPINEPHrine 0.3 mg/0.3 mL Injection Auto-Injector, Inject 0.3 mL (0.3 mg total) into the muscle Once, as needed  meloxicam (MOBIC) 15 mg Oral Tablet, TAKE 1 TABLET BY MOUTH EVERY DAY AS NEEDED FOR PAIN WITH FOOD  multivitamin-iron-folic acid (CENTRUM) 18-400 mg-mcg Oral Tablet, Take 1 Tablet by mouth Once a day  amLODIPine (NORVASC) 5 mg Oral Tablet, Take 1 Tablet (5 mg total) by mouth Once a day  escitalopram  oxalate (LEXAPRO) 20 mg Oral Tablet, TAKE 1 TABLET BY MOUTH ONCE A DAY  topiramate (TOPAMAX) 25 mg Oral Tablet, Take 1 Tablet (25 mg total) by mouth Once a day    No facility-administered medications prior to visit.    Allergies:  Allergies   Allergen Reactions    Codeine Hives/ Urticaria    Diphenhydramine Hcl Hives/ Urticaria    Penicillins Hives/ Urticaria    Diphenhydramine Hives/ Urticaria       Physical Exam:  Vitals:    06/16/22 1437   BP: 122/82   Pulse: 76   Temp: 36.4 C (97.5 F)   TempSrc: Tympanic   SpO2: 98%   Weight: 97.1 kg (214 lb)   Height: 1.6 m (5\' 3" )   BMI: 37.99      Physical Exam  Vitals and nursing note reviewed.   Constitutional:       Appearance: Normal appearance. She is obese.   HENT:      Right Ear: Tympanic membrane normal.      Left Ear: Tympanic membrane normal.      Mouth/Throat:      Mouth: Mucous membranes are moist.      Pharynx: Oropharynx is clear.   Eyes:      Pupils: Pupils are equal, round, and reactive to light.   Cardiovascular:      Rate and Rhythm: Normal rate and regular rhythm.      Pulses: Normal pulses.   Pulmonary:      Effort: Pulmonary effort is normal.      Breath sounds: Normal breath sounds.   Abdominal:      General: Bowel sounds are normal.      Palpations: Abdomen is soft.      Tenderness: There is no abdominal tenderness.   Musculoskeletal:         General: No swelling or tenderness. Normal range of motion.      Cervical back: Normal range of motion and neck supple.      Comments: Right elbow medial incision area looks well  Gait stable   Skin:     General: Skin is warm.  Findings: No lesion or rash.   Neurological:      General: No focal deficit present.      Mental Status: She is alert and oriented to person, place, and time.   Psychiatric:         Mood and Affect: Mood normal.         Behavior: Behavior normal.         Thought Content: Thought content normal.         Judgment: Judgment normal.         Assessment/Plan:  Problem List Items Addressed  This Visit          Cardiovascular System    Hypertension       Neurologic    Migraine       Psychiatric    Anxiety    Depression       Other    Obesity (BMI 35.0-39.9 without comorbidity)     Other Visit Diagnoses       Weight gain    -  Primary             Labs up-to-date this time  Patient started on Mounjaro 2.5 mg weekly  Strict diet exercise weight loss regimen encouraged  Refuses flu shot  Meds refilled as noted  continue to monitor BP outpt w logs  Continue f/u w  cardiologist as scheduled  Continue f/u w  Neurologist/ortho as scheduled      Post-Discharge Follow Up Appointments       Monday Sep 15, 2022    Return Patient Visit with Eyvonne Mechanic, PA-C at  1:30 PM      Internal Medicine, Building A  [facility]  [address]  [place] [address]  [phone]           Seek medical attention for new or worsening symptoms.  Patient has been seen in this clinic within the last 3 years.     Creta Dorame, PA-C          This note was partially created using MModal Fluency Direct system (voice recognition software) and is inherently subject to errors including those of syntax and "sound-alike" substitutions which may escape proofreading.  In such instances, original meaning may be extrapolated by contextual derivation.

## 2022-06-16 NOTE — Nursing Note (Signed)
Patient is here for routine follow up for medication refills.

## 2022-06-17 ENCOUNTER — Encounter (INDEPENDENT_AMBULATORY_CARE_PROVIDER_SITE_OTHER): Payer: Self-pay | Admitting: Physician Assistant

## 2022-06-17 MED ORDER — MOUNJARO 2.5 MG/0.5 ML SUBCUTANEOUS PEN INJECTOR
2.5000 mg | PEN_INJECTOR | SUBCUTANEOUS | 0 refills | Status: AC
Start: 2022-06-17 — End: 2022-07-17

## 2022-06-18 ENCOUNTER — Ambulatory Visit (INDEPENDENT_AMBULATORY_CARE_PROVIDER_SITE_OTHER): Payer: Commercial Managed Care - PPO | Admitting: Physician Assistant

## 2022-06-23 ENCOUNTER — Other Ambulatory Visit (RURAL_HEALTH_CENTER): Payer: Self-pay | Admitting: Physician Assistant

## 2022-06-23 DIAGNOSIS — F32A Depression, unspecified: Secondary | ICD-10-CM

## 2022-06-24 NOTE — Telephone Encounter (Signed)
Duplicate

## 2022-07-03 ENCOUNTER — Encounter (INDEPENDENT_AMBULATORY_CARE_PROVIDER_SITE_OTHER): Payer: Self-pay | Admitting: Physician Assistant

## 2022-07-07 ENCOUNTER — Encounter (INDEPENDENT_AMBULATORY_CARE_PROVIDER_SITE_OTHER): Payer: Self-pay | Admitting: Physician Assistant

## 2022-07-15 ENCOUNTER — Ambulatory Visit (INDEPENDENT_AMBULATORY_CARE_PROVIDER_SITE_OTHER): Payer: PRIVATE HEALTH INSURANCE | Admitting: NEUROLOGY

## 2022-07-17 ENCOUNTER — Ambulatory Visit (INDEPENDENT_AMBULATORY_CARE_PROVIDER_SITE_OTHER): Payer: PRIVATE HEALTH INSURANCE | Admitting: NEUROLOGY

## 2022-07-24 ENCOUNTER — Ambulatory Visit (INDEPENDENT_AMBULATORY_CARE_PROVIDER_SITE_OTHER): Payer: PRIVATE HEALTH INSURANCE | Admitting: NEUROLOGY

## 2022-07-28 ENCOUNTER — Encounter (INDEPENDENT_AMBULATORY_CARE_PROVIDER_SITE_OTHER): Payer: Self-pay

## 2022-07-28 ENCOUNTER — Telehealth (INDEPENDENT_AMBULATORY_CARE_PROVIDER_SITE_OTHER): Payer: Self-pay | Admitting: Physician Assistant

## 2022-07-28 MED ORDER — ONDANSETRON 8 MG DISINTEGRATING TABLET
8.0000 mg | ORAL_TABLET | Freq: Three times a day (TID) | ORAL | 3 refills | Status: DC | PRN
Start: 2022-07-28 — End: 2022-12-16

## 2022-07-28 NOTE — Telephone Encounter (Signed)
Patient wanted to know if we could call in medication for nausea for her?

## 2022-08-04 ENCOUNTER — Other Ambulatory Visit (INDEPENDENT_AMBULATORY_CARE_PROVIDER_SITE_OTHER): Payer: Self-pay | Admitting: Physician Assistant

## 2022-08-04 ENCOUNTER — Ambulatory Visit (INDEPENDENT_AMBULATORY_CARE_PROVIDER_SITE_OTHER): Payer: Self-pay | Admitting: NEUROLOGY

## 2022-08-04 MED ORDER — ZEPBOUND 2.5 MG/0.5 ML SUBCUTANEOUS PEN INJECTOR
2.5000 mg | PEN_INJECTOR | SUBCUTANEOUS | 2 refills | Status: DC
Start: 2022-08-04 — End: 2022-09-15

## 2022-08-25 ENCOUNTER — Encounter (INDEPENDENT_AMBULATORY_CARE_PROVIDER_SITE_OTHER): Payer: Self-pay | Admitting: Physician Assistant

## 2022-09-15 ENCOUNTER — Encounter (INDEPENDENT_AMBULATORY_CARE_PROVIDER_SITE_OTHER): Payer: Self-pay | Admitting: Physician Assistant

## 2022-09-15 ENCOUNTER — Ambulatory Visit: Payer: Commercial Managed Care - PPO | Attending: Physician Assistant | Admitting: Physician Assistant

## 2022-09-15 ENCOUNTER — Other Ambulatory Visit: Payer: Self-pay

## 2022-09-15 VITALS — BP 118/84 | HR 81 | Temp 97.4°F | Ht 63.0 in | Wt 199.0 lb

## 2022-09-15 DIAGNOSIS — E7800 Pure hypercholesterolemia, unspecified: Secondary | ICD-10-CM

## 2022-09-15 DIAGNOSIS — H6590 Unspecified nonsuppurative otitis media, unspecified ear: Secondary | ICD-10-CM

## 2022-09-15 DIAGNOSIS — I10 Essential (primary) hypertension: Secondary | ICD-10-CM | POA: Insufficient documentation

## 2022-09-15 DIAGNOSIS — J3089 Other allergic rhinitis: Secondary | ICD-10-CM | POA: Insufficient documentation

## 2022-09-15 DIAGNOSIS — E611 Iron deficiency: Secondary | ICD-10-CM | POA: Insufficient documentation

## 2022-09-15 DIAGNOSIS — R5382 Chronic fatigue, unspecified: Secondary | ICD-10-CM | POA: Insufficient documentation

## 2022-09-15 DIAGNOSIS — F32A Depression, unspecified: Secondary | ICD-10-CM

## 2022-09-15 DIAGNOSIS — F419 Anxiety disorder, unspecified: Secondary | ICD-10-CM

## 2022-09-15 DIAGNOSIS — R0981 Nasal congestion: Secondary | ICD-10-CM | POA: Insufficient documentation

## 2022-09-15 DIAGNOSIS — R5383 Other fatigue: Secondary | ICD-10-CM

## 2022-09-15 DIAGNOSIS — G43809 Other migraine, not intractable, without status migrainosus: Secondary | ICD-10-CM | POA: Insufficient documentation

## 2022-09-15 LAB — COMPREHENSIVE METABOLIC PNL, FASTING
ALBUMIN/GLOBULIN RATIO: 1.5 — ABNORMAL HIGH (ref 0.8–1.4)
ALBUMIN: 4.5 g/dL (ref 3.5–5.7)
ALKALINE PHOSPHATASE: 52 U/L (ref 34–104)
ALT (SGPT): 22 U/L (ref 7–52)
ANION GAP: 5 mmol/L (ref 4–13)
AST (SGOT): 20 U/L (ref 13–39)
BILIRUBIN TOTAL: 0.5 mg/dL (ref 0.3–1.2)
BUN/CREA RATIO: 14 (ref 6–22)
BUN: 12 mg/dL (ref 7–25)
CALCIUM, CORRECTED: 9 mg/dL (ref 8.9–10.8)
CALCIUM: 9.4 mg/dL (ref 8.6–10.3)
CHLORIDE: 105 mmol/L (ref 98–107)
CO2 TOTAL: 24 mmol/L (ref 21–31)
CREATININE: 0.86 mg/dL (ref 0.60–1.30)
ESTIMATED GFR: 88 mL/min/{1.73_m2} (ref >59)
GLOBULIN: 3.1 (ref 2.9–5.4)
GLUCOSE: 94 mg/dL (ref 74–109)
OSMOLALITY, CALCULATED: 268 mOsm/kg — ABNORMAL LOW (ref 270–290)
POTASSIUM: 4.2 mmol/L (ref 3.5–5.1)
PROTEIN TOTAL: 7.6 g/dL (ref 6.4–8.9)
SODIUM: 134 mmol/L — ABNORMAL LOW (ref 136–145)

## 2022-09-15 LAB — CBC WITH DIFF
BASOPHIL #: 0 10*3/uL (ref 0.00–0.10)
BASOPHIL %: 0 % (ref 0–1)
EOSINOPHIL #: 0.1 10*3/uL (ref 0.00–0.50)
EOSINOPHIL %: 1 %
HCT: 41.5 % (ref 31.2–41.9)
HGB: 14.4 g/dL — ABNORMAL HIGH (ref 10.9–14.3)
LYMPHOCYTE #: 2.1 10*3/uL (ref 1.00–3.00)
LYMPHOCYTE %: 26 % (ref 16–44)
MCH: 29 pg (ref 24.7–32.8)
MCHC: 34.7 g/dL (ref 32.3–35.6)
MCV: 83.5 fL (ref 75.5–95.3)
MONOCYTE #: 0.5 10*3/uL (ref 0.30–1.00)
MONOCYTE %: 6 % (ref 5–13)
MPV: 7.5 fL — ABNORMAL LOW (ref 7.9–10.8)
NEUTROPHIL #: 5.4 10*3/uL (ref 1.85–7.80)
NEUTROPHIL %: 67 % (ref 43–77)
PLATELETS: 287 10*3/uL (ref 140–440)
RBC: 4.97 10*6/uL — ABNORMAL HIGH (ref 3.63–4.92)
RDW: 12.8 % (ref 12.3–17.7)
WBC: 8.2 10*3/uL (ref 3.8–11.8)

## 2022-09-15 LAB — IRON TRANSFERRIN AND TIBC
IRON (TRANSFERRIN) SATURATION: 15 % (ref 15–50)
IRON: 49 ug/dL — ABNORMAL LOW (ref 50–212)
TOTAL IRON BINDING CAPACITY: 325 ug/dL (ref 250–450)
TRANSFERRIN: 232 mg/dL (ref 203–362)
UIBC: 276 ug/dL (ref 130–375)

## 2022-09-15 LAB — LIPID PANEL
CHOL/HDL RATIO: 3.3
CHOLESTEROL: 153 mg/dL (ref <200)
HDL CHOL: 47 mg/dL (ref 23–92)
LDL CALC: 82 mg/dL (ref 0–100)
TRIGLYCERIDES: 121 mg/dL (ref <=150)
VLDL CALC: 24 mg/dL (ref 0–50)

## 2022-09-15 LAB — THYROID STIMULATING HORMONE WITH FREE T4 REFLEX: TSH: 1.368 u[IU]/mL (ref 0.450–5.330)

## 2022-09-15 LAB — MAGNESIUM: MAGNESIUM: 1.9 mg/dL (ref 1.9–2.7)

## 2022-09-15 MED ORDER — AMLODIPINE 5 MG TABLET
5.0000 mg | ORAL_TABLET | Freq: Every day | ORAL | 1 refills | Status: DC
Start: 2022-09-15 — End: 2022-12-16

## 2022-09-15 MED ORDER — TRIAMCINOLONE ACETONIDE 40 MG/ML SUSPENSION FOR INJECTION
40.0000 mg | INTRAMUSCULAR | Status: AC
Start: 2022-09-15 — End: 2022-09-15
  Administered 2022-09-15: 40 mg via INTRAMUSCULAR

## 2022-09-15 MED ORDER — AZITHROMYCIN 250 MG TABLET
ORAL_TABLET | ORAL | 0 refills | Status: DC
Start: 2022-09-15 — End: 2022-12-16

## 2022-09-15 NOTE — Progress Notes (Signed)
INTERNAL MEDICINE, BUILDING A  [address]  [place] [address]  Operated by [facility]  History and Physical     Name: Stacy Cantrell MRN:  [id number]   Date: 09/15/2022 Age: 42 y.o.               Follow Up        Reason for Visit: Follow Up 3 Months (Routine ) sinus congestion drainage    History of Present Illness  Stacy Cantrell is a 42 y.o. female who is being seen today in the office for follow-up however has complaints of sinus and nasal congestion since yesterday.  She states she has got a lot of drainage however it is clear in color and thinks it is allergy related.  She did start her Zyrtec back this morning.  She has had some pressure in her ears but denies sore throat Fever chills body aches.    Follow-up blood pressure/hypertension for which he is currently taking Norvasc 5 mg daily.  Blood pressure today is stable at 118/84.  She also has  Raynaud's which the Norvasc has helped.  Currently at this time she denies any chest pain headaches dizziness.   She is also being followed with a cardiologist and had recent noted nuclear stress test and echocardiogram.    Follow-up cholesterol with history of elevation noted in the past however patient currently not on cholesterol medication.  She does try and watch her diet but denies getting a lot of activity.  Follow-up labs needed.    Follow-up iron deficiency with previous saturation low and patient currently taking multivitamin with iron.  She does have chronic fatigue issues but denies abdominal pain bowel changes blood in the stool.    Follow-up chronic anxiety/depression with patient currently taking Lexapro 20 mg daily along with BuSpar as needed.  At this time anxiety is stable and patient overall is doing well concerning her anxiety and depression.  She denies panic attacks suicidal thoughts ideation.    Follow-up chronic h eadaches /migraines for which she is now taking Topamax 25 mg daily.  She states medication  does help and headaches have been less.  Denies any dizziness syncope.    Patient also has a history of chronic numbness tingling right hand with ulnar nerve entrapment previously noted for which she underwent surgery by Dr. Sharol Roussel to release ulnar nerve in the right elbow.   She is also seeing Dr Elisabeth Cara in [facility] ortho for her knees.    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     Allergic rhinitis     Anxiety     COVID-19 vaccine series declined     Depression     Gastroenteritis     Headache     Insomnia disorder          Past Surgical History:   Procedure Laterality Date    HX CARPAL TUNNEL RELEASE Right     HX HYSTERECTOMY      VASCULAR SURGERY Right     vein stripped from right leg      Family Medical History:       Problem Relation (Age of Onset)    Breast Cancer Other    Cervical Cancer Other    Diabetes type II Maternal Grandmother    Elevated Lipids Father  Hypertension (High Blood Pressure) Father, Paternal Grandmother            Social History     Tobacco Use    Smoking status: Former     Current packs/day: 0.00     Average packs/day: 0.5 packs/day for 5.0 years (2.5 ttl pk-yrs)     Types: Cigarettes     Start date: 06/30/2009     Quit date: 06/30/2014     Years since quitting: 8.2    Smokeless tobacco: Never   Vaping Use    Vaping status: Never Used   Substance Use Topics    Alcohol use: Not Currently     Alcohol/week: 1.0 standard drink of alcohol     Types: 1 Glasses of wine per week    Drug use: Never     Medication:  cetirizine (ZYRTEC) 10 mg Oral Tablet, Take 1 Tablet (10 mg total) by mouth Once per day as needed for Allergies  cyanocobalamin (VITAMIN B12) 1,000 mcg/mL Injection Solution, Inject 1 mL (1,000 mcg total) under the skin Every 30 days  EPINEPHrine 0.3 mg/0.3 mL Injection Auto-Injector, Inject 0.3 mL (0.3 mg total) into the muscle Once, as needed  escitalopram oxalate (LEXAPRO) 20  mg Oral Tablet, Take 1 Tablet (20 mg total) by mouth Once a day  meloxicam (MOBIC) 15 mg Oral Tablet, TAKE 1 TABLET BY MOUTH EVERY DAY AS NEEDED FOR PAIN WITH FOOD  multivitamin-iron-folic acid (CENTRUM) 123XX123 mg-mcg Oral Tablet, Take 1 Tablet by mouth Once a day  ondansetron (ZOFRAN ODT) 8 mg Oral Tablet, Rapid Dissolve, Place 1 Tablet (8 mg total) under the tongue Every 8 hours as needed for Nausea/Vomiting  topiramate (TOPAMAX) 25 mg Oral Tablet, Take 1 Tablet (25 mg total) by mouth Once a day  amLODIPine (NORVASC) 5 mg Oral Tablet, Take 1 Tablet (5 mg total) by mouth Once a day  tirzepatide, weight loss, (ZEPBOUND) 2.5 mg/0.5 mL Subcutaneous Pen Injector, Inject 0.5 mL (2.5 mg total) under the skin Every 7 days Indications: weight loss management for an obese person    No facility-administered medications prior to visit.    Allergies:  Allergies   Allergen Reactions    Codeine Hives/ Urticaria    Diphenhydramine Hcl Hives/ Urticaria    Penicillins Hives/ Urticaria    Diphenhydramine Hives/ Urticaria       Physical Exam:  Vitals:    09/15/22 1336   BP: 118/84   Pulse: 81   Temp: 36.3 C (97.4 F)   TempSrc: Tympanic   SpO2: 98%   Weight: 90.3 kg (199 lb)   Height: 1.6 m (5\' 3" )   BMI: 35.32      Physical Exam  Vitals and nursing note reviewed.   Constitutional:       Appearance: Normal appearance. She is obese.   HENT:      Ears:      Comments: TMs dull bilateral with fluid noted left     Nose:      Comments: No sinus cavity tenderness on palpation however pressure frontal sinus with forward flexion of head     Mouth/Throat:      Mouth: Mucous membranes are moist.      Pharynx: Oropharynx is clear. No posterior oropharyngeal erythema.   Eyes:      Pupils: Pupils are equal, round, and reactive to light.   Cardiovascular:      Rate and Rhythm: Normal rate and regular rhythm.      Pulses: Normal pulses.   Pulmonary:  Effort: Pulmonary effort is normal.      Breath sounds: Normal breath sounds.   Abdominal:       General: Bowel sounds are normal.      Palpations: Abdomen is soft.      Tenderness: There is no abdominal tenderness.   Musculoskeletal:         General: No swelling or tenderness. Normal range of motion.      Cervical back: Normal range of motion and neck supple.      Comments: Right elbow medial incision area looks well  Gait stable   Lymphadenopathy:      Cervical: No cervical adenopathy.   Skin:     General: Skin is warm.      Findings: No lesion or rash.   Neurological:      General: No focal deficit present.      Mental Status: She is alert and oriented to person, place, and time.   Psychiatric:         Mood and Affect: Mood normal.         Behavior: Behavior normal.         Thought Content: Thought content normal.         Judgment: Judgment normal.         Assessment/Plan:  Problem List Items Addressed This Visit       Anxiety    Depression    Chronic fatigue    Relevant Orders    CBC/DIFF (Completed)    Elevated cholesterol    Relevant Orders    COMPREHENSIVE METABOLIC PNL, FASTING    LIPID PANEL    Migraine    Hypertension    Relevant Orders    COMPREHENSIVE METABOLIC PNL, FASTING     Other Visit Diagnoses       Sinus congestion    -  Primary    Fluid collection of middle ear        Environmental and seasonal allergies        Iron deficiency        Relevant Orders    IRON TRANSFERRIN AND TIBC    Fatigue, unspecified type        Relevant Orders    MAGNESIUM    THYROID STIMULATING HORMONE WITH FREE T4 REFLEX            Labs obtained in office  Kenalog injection given for fluid middle ear  Z-Pak as directed  Continue Zyrtec daily  Refuses flu shot  Meds refilled as noted  Continue f/u w  cardiologist as scheduled  Continue f/u w  Neurologist/ortho as scheduled  Follow-up pending lab results otherwise as scheduled    Post-Discharge Follow Up Appointments       Tuesday Dec 16, 2022    Return Patient Visit with Cleophus Molt, PA-C at  1:00 PM      Internal Medicine, Building A  [facility]  [address]  [place] [address]  [phone]           Seek medical attention for new or worsening symptoms.  Patient has been seen in this clinic within the last 3 years.     Delainee Tramel, PA-C          This note was partially created using MModal Fluency Direct system (voice recognition software) and is inherently subject to errors including those of syntax and "sound-alike" substitutions which may escape proofreading.  In such instances, original meaning may be extrapolated by contextual derivation.

## 2022-09-15 NOTE — Nursing Note (Signed)
Patient is here for routine 3 month follow up for medication refills.

## 2022-09-16 NOTE — Result Encounter Note (Signed)
Patient notified, agreed to multivitamin with iron

## 2022-09-16 NOTE — Telephone Encounter (Signed)
-----   Message from Cleophus Molt, [place] sent at 09/15/2022  5:22 PM EDT -----  Sodium wishes little bit low make sure stays hydrated electrolytes such as Gatorade  Iron just borderline low make sure taking multivitamin with iron labs otherwise look good

## 2022-09-22 ENCOUNTER — Encounter (INDEPENDENT_AMBULATORY_CARE_PROVIDER_SITE_OTHER): Payer: Self-pay | Admitting: Physician Assistant

## 2022-10-10 ENCOUNTER — Other Ambulatory Visit (RURAL_HEALTH_CENTER): Payer: Self-pay | Admitting: Physician Assistant

## 2022-11-25 ENCOUNTER — Encounter (INDEPENDENT_AMBULATORY_CARE_PROVIDER_SITE_OTHER): Payer: Self-pay | Admitting: Physician Assistant

## 2022-12-16 ENCOUNTER — Other Ambulatory Visit: Payer: Self-pay

## 2022-12-16 ENCOUNTER — Encounter (INDEPENDENT_AMBULATORY_CARE_PROVIDER_SITE_OTHER): Payer: Self-pay | Admitting: Physician Assistant

## 2022-12-16 ENCOUNTER — Ambulatory Visit: Payer: Commercial Managed Care - PPO | Attending: Physician Assistant | Admitting: Physician Assistant

## 2022-12-16 VITALS — BP 106/82 | HR 82 | Ht 63.0 in | Wt 197.0 lb

## 2022-12-16 DIAGNOSIS — Z1159 Encounter for screening for other viral diseases: Secondary | ICD-10-CM | POA: Insufficient documentation

## 2022-12-16 DIAGNOSIS — Z0000 Encounter for general adult medical examination without abnormal findings: Secondary | ICD-10-CM

## 2022-12-16 DIAGNOSIS — R232 Flushing: Secondary | ICD-10-CM | POA: Insufficient documentation

## 2022-12-16 DIAGNOSIS — G4700 Insomnia, unspecified: Secondary | ICD-10-CM | POA: Insufficient documentation

## 2022-12-16 LAB — CBC WITH DIFF
BASOPHIL #: 0 10*3/uL (ref 0.00–0.10)
BASOPHIL %: 0 % (ref 0–1)
EOSINOPHIL #: 0.2 10*3/uL (ref 0.00–0.50)
EOSINOPHIL %: 2 %
HCT: 41.1 % (ref 31.2–41.9)
HGB: 14 g/dL (ref 10.9–14.3)
LYMPHOCYTE #: 2.9 10*3/uL (ref 1.00–3.00)
LYMPHOCYTE %: 22 % (ref 16–44)
MCH: 28.9 pg (ref 24.7–32.8)
MCHC: 34 g/dL (ref 32.3–35.6)
MCV: 85 fL (ref 75.5–95.3)
MONOCYTE #: 0.8 10*3/uL (ref 0.30–1.00)
MONOCYTE %: 6 % (ref 5–13)
MPV: 7.3 fL — ABNORMAL LOW (ref 7.9–10.8)
NEUTROPHIL #: 9.2 10*3/uL — ABNORMAL HIGH (ref 1.85–7.80)
NEUTROPHIL %: 70 % (ref 43–77)
PLATELETS: 308 10*3/uL (ref 140–440)
RBC: 4.83 10*6/uL (ref 3.63–4.92)
RDW: 13 % (ref 12.3–17.7)
WBC: 13.1 10*3/uL — ABNORMAL HIGH (ref 3.8–11.8)

## 2022-12-16 LAB — COMPREHENSIVE METABOLIC PNL, FASTING
ALBUMIN/GLOBULIN RATIO: 1.4 (ref 0.8–1.4)
ALBUMIN: 4 g/dL (ref 3.5–5.7)
ALKALINE PHOSPHATASE: 50 U/L (ref 34–104)
ALT (SGPT): 18 U/L (ref 7–52)
ANION GAP: 4 mmol/L (ref 4–13)
AST (SGOT): 14 U/L (ref 13–39)
BILIRUBIN TOTAL: 0.4 mg/dL (ref 0.3–1.2)
BUN/CREA RATIO: 21 (ref 6–22)
BUN: 16 mg/dL (ref 7–25)
CALCIUM, CORRECTED: 9 mg/dL (ref 8.9–10.8)
CALCIUM: 9 mg/dL (ref 8.6–10.3)
CHLORIDE: 105 mmol/L (ref 98–107)
CO2 TOTAL: 29 mmol/L (ref 21–31)
CREATININE: 0.78 mg/dL (ref 0.60–1.30)
ESTIMATED GFR: 98 mL/min/{1.73_m2} (ref >59)
GLOBULIN: 2.9 (ref 2.9–5.4)
GLUCOSE: 91 mg/dL (ref 74–109)
OSMOLALITY, CALCULATED: 277 mOsm/kg (ref 270–290)
POTASSIUM: 4.1 mmol/L (ref 3.5–5.1)
PROTEIN TOTAL: 6.9 g/dL (ref 6.4–8.9)
SODIUM: 138 mmol/L (ref 136–145)

## 2022-12-16 LAB — IRON TRANSFERRIN AND TIBC
IRON (TRANSFERRIN) SATURATION: 15 % (ref 15–50)
IRON: 48 ug/dL — ABNORMAL LOW (ref 50–212)
TOTAL IRON BINDING CAPACITY: 326 ug/dL (ref 250–450)
TRANSFERRIN: 233 mg/dL (ref 203–362)
UIBC: 278 ug/dL (ref 130–375)

## 2022-12-16 LAB — FSH: FSH: 7 m[IU]/mL (ref 1.4–18.1)

## 2022-12-16 LAB — LIPID PANEL
CHOL/HDL RATIO: 2.4
CHOLESTEROL: 150 mg/dL (ref <200)
HDL CHOL: 62 mg/dL (ref >=40)
LDL CALC: 78 mg/dL (ref 0–100)
TRIGLYCERIDES: 49 mg/dL (ref <=150)
VLDL CALC: 10 mg/dL (ref 0–50)

## 2022-12-16 MED ORDER — AMLODIPINE 5 MG TABLET
5.0000 mg | ORAL_TABLET | Freq: Every day | ORAL | 1 refills | Status: DC
Start: 2022-12-16 — End: 2023-09-01

## 2022-12-16 MED ORDER — ZOLPIDEM 5 MG TABLET
5.0000 mg | ORAL_TABLET | Freq: Every evening | ORAL | 1 refills | Status: DC | PRN
Start: 2022-12-16 — End: 2023-03-19

## 2022-12-16 MED ORDER — TOPIRAMATE 25 MG TABLET
25.0000 mg | ORAL_TABLET | Freq: Every day | ORAL | 1 refills | Status: DC
Start: 2022-12-16 — End: 2023-03-19

## 2022-12-16 MED ORDER — ONDANSETRON 8 MG DISINTEGRATING TABLET
8.0000 mg | ORAL_TABLET | Freq: Three times a day (TID) | ORAL | 3 refills | Status: DC | PRN
Start: 2022-12-16 — End: 2023-07-08

## 2022-12-16 MED ORDER — CETIRIZINE 10 MG TABLET
10.0000 mg | ORAL_TABLET | Freq: Every day | ORAL | 1 refills | Status: DC | PRN
Start: 2022-12-16 — End: 2023-03-25

## 2022-12-16 MED ORDER — ESCITALOPRAM 20 MG TABLET
20.0000 mg | ORAL_TABLET | Freq: Every day | ORAL | 1 refills | Status: DC
Start: 2022-12-16 — End: 2023-03-25

## 2022-12-16 NOTE — Nursing Note (Signed)
Patient is here for annual exam

## 2022-12-16 NOTE — Progress Notes (Signed)
INTERNAL MEDICINE, BUILDING A  [address]  [place] [address]  Operated by [facility]  Progress Note    Name: [name] MRN:  [id number]   Date: 12/16/2022 DOB:  03-Mar-1981 (42 y.o.)             12/16/2022    [facility]    Chief Complaint   Patient presents with    Annual Wellness Exam       HPI:   Patient states that she is feeling well overall and has a Fair energy level.  She doeseat a balanced diet and has  plenty of water intake each day.  She says that she is moderately active and  averages approximately  4 hours per night due to hot flashes. Would like hormone levels checked w labs today as well as something to help her sleep better. Request med refills as well.      Last Menstrual Period: partial hysterectomy  2022  Refuses any further pelvic exams         No results found for this or any previous visit (from the past 47829 hour(s)).      Past Medical History  Current Outpatient Medications   Medication Sig    amLODIPine (NORVASC) 5 mg Oral Tablet Take 1 Tablet (5 mg total) by mouth Once a day    cetirizine (ZYRTEC) 10 mg Oral Tablet Take 1 Tablet (10 mg total) by mouth Once per day as needed for Allergies    cyanocobalamin (VITAMIN B12) 1,000 mcg/mL Injection Solution Inject 1 mL (1,000 mcg total) under the skin Every 30 days    EPINEPHrine 0.3 mg/0.3 mL Injection Auto-Injector Inject 0.3 mL (0.3 mg total) into the muscle Once, as needed    escitalopram oxalate (LEXAPRO) 20 mg Oral Tablet Take 1 Tablet (20 mg total) by mouth Once a day    meloxicam (MOBIC) 15 mg Oral Tablet TAKE 1 TABLET BY MOUTH EVERY DAY AS NEEDED FOR PAIN WITH FOOD    multivitamin-iron-folic acid (CENTRUM) 18-400 mg-mcg Oral Tablet Take 1 Tablet by mouth Once a day    ondansetron (ZOFRAN ODT) 8 mg Oral Tablet, Rapid Dissolve Place 1 Tablet (8 mg total) under the tongue Every 8 hours as needed for Nausea/Vomiting    topiramate (TOPAMAX) 25 mg Oral Tablet Take 1 Tablet (25 mg total) by mouth Once a  day    zolpidem (AMBIEN) 5 mg Oral Tablet Take 1 Tablet (5 mg total) by mouth Every night as needed for Insomnia     Allergies   Allergen Reactions    Codeine Hives/ Urticaria    Diphenhydramine Hcl Hives/ Urticaria    Penicillins Hives/ Urticaria    Diphenhydramine Hives/ Urticaria     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  Family Medical History:       Problem Relation (Age of Onset)    Breast Cancer Other    Cervical Cancer Other    Diabetes type II Maternal Grandmother    Elevated Lipids Father    Hypertension (High Blood Pressure) Father, Paternal Grandmother            Social History     Socioeconomic History    Marital status: Married   Tobacco Use    Smoking status: Former     Current packs/day: 0.00     Average packs/day: 0.5 packs/day for 15.7 years (7.9 ttl pk-yrs)     Types: Cigarettes     Start date: 10/06/1998     Quit date: 06/30/2014     Years since quitting: 8.4    Smokeless tobacco: Never   Vaping Use    Vaping status: Never Used   Substance and Sexual Activity    Alcohol use: Yes     Alcohol/week: 4.0 standard drinks of alcohol     Types: 4 Glasses of wine per week    Drug use: Never    Sexual activity: Yes     Partners: Male       ROS:  Pertinent positives included in the HPI.     BP 106/82 (Site: Left, Patient Position: Sitting, Cuff Size: Adult)   Pulse 82   Ht 1.6 m (5\' 3" )   Wt 89.4 kg (197 lb)   SpO2 96%   BMI 34.90 kg/m       Vitals stable  HEENT:  pupils equal and reactive to light, TM's clear, no nasal congestion, throat nonerythematous  Neck:  no  lymphadenopathy. no thyromegaly. no carotid bruits.   Lungs: clear to auscultation bilaterally, normal effort  CV: regular rate and rhythm without murmur, pulses 2+ throughout  Abdomen: soft, non-tender, nondistended, normoactive bowel sounds present  Skin: pink, warm, no rashes present  Musculoskeletal:  no joint deformities, no peripheral edema present, back non-tender on palpation  Neuro:  cranial nerves grossly intact, normal sensation, normal muscle tone and strength  Psych:  alert and oriented x 3, normal mood and affect        Stacy Cantrell was seen today for annual wellness exam.    Diagnoses and all orders for this visit:    Health maintenance examination  -     HEPATITIS C ANTIBODY SCREEN WITH REFLEX TO HCV PCR; Future  -     CBC/DIFF; Future  -     COMPREHENSIVE METABOLIC PNL, FASTING; Future  -     IRON TRANSFERRIN AND TIBC; Future  -     LIPID PANEL; Future    Insomnia, unspecified type    Hot flashes  -     FSH; Future  -     ESTRADIOL; Future    Need for hepatitis C screening test  -     HEPATITIS C ANTIBODY SCREEN WITH REFLEX TO HCV PCR; Future  -     CBC/DIFF; Future  -     COMPREHENSIVE METABOLIC PNL, FASTING; Future  -     IRON TRANSFERRIN AND TIBC; Future  -     LIPID PANEL; Future    Other orders  -     amLODIPine (NORVASC) 5 mg Oral Tablet; Take 1 Tablet (5 mg total) by mouth Once a day  -     escitalopram oxalate (LEXAPRO) 20 mg Oral Tablet; Take 1 Tablet (20 mg total) by mouth Once a day  -  topiramate (TOPAMAX) 25 mg Oral Tablet; Take 1 Tablet (25 mg total) by mouth Once a day  -     cetirizine (ZYRTEC) 10 mg Oral Tablet; Take 1 Tablet (10 mg total) by mouth Once per day as needed for Allergies  -     ondansetron (ZOFRAN ODT) 8 mg Oral Tablet, Rapid Dissolve; Place 1 Tablet (8 mg total) under the tongue Every 8 hours as needed for Nausea/Vomiting  -     zolpidem (AMBIEN) 5 mg Oral Tablet; Take 1 Tablet (5 mg total) by mouth Every night as needed for Insomnia        Return in 1 year (on  12/16/2023). Well check    Post-Discharge Follow Up Appointments       Thursday Mar 19, 2023    Return Patient Visit with Eyvonne Mechanic, PA-C at  8:30 AM      Friday Dec 18, 2023    Return Patient Visit with Eyvonne Mechanic, PA-C at  8:30 AM      Internal Medicine, Building A  Building [address], [place]  [address]  [place] [address]  [phone]             Eyvonne Mechanic, PA-C

## 2022-12-18 ENCOUNTER — Telehealth (INDEPENDENT_AMBULATORY_CARE_PROVIDER_SITE_OTHER): Payer: Self-pay | Admitting: Physician Assistant

## 2022-12-18 DIAGNOSIS — D72829 Elevated white blood cell count, unspecified: Secondary | ICD-10-CM

## 2022-12-18 LAB — HEPATITIS C ANTIBODY SCREEN WITH REFLEX TO HCV PCR: HCV ANTIBODY QUALITATIVE: NEGATIVE

## 2022-12-18 LAB — ESTRADIOL: ESTRADIOL: 123 pg/mL

## 2022-12-18 NOTE — Telephone Encounter (Signed)
-----   Message from Eyvonne Mechanic, [place] sent at 12/18/2022 11:55 AM EDT -----  Stacy Cantrell was up some? Sick sx if not lets recheck cbc 2 weeks

## 2022-12-28 ENCOUNTER — Other Ambulatory Visit: Payer: Self-pay

## 2022-12-29 ENCOUNTER — Other Ambulatory Visit: Payer: Self-pay

## 2022-12-29 ENCOUNTER — Ambulatory Visit: Payer: Commercial Managed Care - PPO | Attending: Physician Assistant

## 2022-12-29 DIAGNOSIS — D72829 Elevated white blood cell count, unspecified: Secondary | ICD-10-CM | POA: Insufficient documentation

## 2022-12-29 LAB — CBC WITH DIFF
BASOPHIL #: 0.1 10*3/uL (ref 0.00–0.10)
BASOPHIL %: 1 % (ref 0–1)
EOSINOPHIL #: 0.1 10*3/uL (ref 0.00–0.50)
EOSINOPHIL %: 1 %
HCT: 41.3 % (ref 31.2–41.9)
HGB: 14.1 g/dL (ref 10.9–14.3)
LYMPHOCYTE #: 1.9 10*3/uL (ref 1.00–3.00)
LYMPHOCYTE %: 25 % (ref 16–44)
MCH: 29.3 pg (ref 24.7–32.8)
MCHC: 34.3 g/dL (ref 32.3–35.6)
MCV: 85.3 fL (ref 75.5–95.3)
MONOCYTE #: 0.4 10*3/uL (ref 0.30–1.00)
MONOCYTE %: 5 % (ref 5–13)
MPV: 7.3 fL — ABNORMAL LOW (ref 7.9–10.8)
NEUTROPHIL #: 5.3 10*3/uL (ref 1.85–7.80)
NEUTROPHIL %: 68 % (ref 43–77)
PLATELETS: 279 10*3/uL (ref 140–440)
RBC: 4.84 10*6/uL (ref 3.63–4.92)
RDW: 13 % (ref 12.3–17.7)
WBC: 7.8 10*3/uL (ref 3.8–11.8)

## 2023-01-05 ENCOUNTER — Other Ambulatory Visit (INDEPENDENT_AMBULATORY_CARE_PROVIDER_SITE_OTHER): Payer: Self-pay | Admitting: Physician Assistant

## 2023-01-22 ENCOUNTER — Encounter (INDEPENDENT_AMBULATORY_CARE_PROVIDER_SITE_OTHER): Payer: Self-pay | Admitting: Physician Assistant

## 2023-02-10 ENCOUNTER — Encounter (INDEPENDENT_AMBULATORY_CARE_PROVIDER_SITE_OTHER): Payer: Self-pay | Admitting: Physician Assistant

## 2023-02-11 ENCOUNTER — Other Ambulatory Visit (INDEPENDENT_AMBULATORY_CARE_PROVIDER_SITE_OTHER): Payer: Self-pay | Admitting: Physician Assistant

## 2023-02-11 MED ORDER — ARIPIPRAZOLE 2 MG TABLET
2.0000 mg | ORAL_TABLET | Freq: Every day | ORAL | 2 refills | Status: DC
Start: 2023-02-11 — End: 2023-03-09

## 2023-03-09 ENCOUNTER — Other Ambulatory Visit (INDEPENDENT_AMBULATORY_CARE_PROVIDER_SITE_OTHER): Payer: Self-pay | Admitting: Physician Assistant

## 2023-03-09 MED ORDER — ARIPIPRAZOLE 2 MG TABLET
2.0000 mg | ORAL_TABLET | Freq: Every day | ORAL | 2 refills | Status: DC
Start: 2023-03-09 — End: 2023-03-25

## 2023-03-19 ENCOUNTER — Other Ambulatory Visit: Payer: Self-pay

## 2023-03-19 ENCOUNTER — Ambulatory Visit: Payer: Commercial Managed Care - PPO | Attending: Physician Assistant | Admitting: Physician Assistant

## 2023-03-19 ENCOUNTER — Encounter (INDEPENDENT_AMBULATORY_CARE_PROVIDER_SITE_OTHER): Payer: Self-pay | Admitting: Physician Assistant

## 2023-03-19 VITALS — BP 110/80 | HR 76 | Ht 63.0 in | Wt 197.0 lb

## 2023-03-19 DIAGNOSIS — M2560 Stiffness of unspecified joint, not elsewhere classified: Secondary | ICD-10-CM | POA: Insufficient documentation

## 2023-03-19 DIAGNOSIS — G43809 Other migraine, not intractable, without status migrainosus: Secondary | ICD-10-CM | POA: Insufficient documentation

## 2023-03-19 DIAGNOSIS — E7800 Pure hypercholesterolemia, unspecified: Secondary | ICD-10-CM | POA: Insufficient documentation

## 2023-03-19 DIAGNOSIS — Z1231 Encounter for screening mammogram for malignant neoplasm of breast: Secondary | ICD-10-CM | POA: Insufficient documentation

## 2023-03-19 DIAGNOSIS — M2550 Pain in unspecified joint: Secondary | ICD-10-CM | POA: Insufficient documentation

## 2023-03-19 DIAGNOSIS — I10 Essential (primary) hypertension: Secondary | ICD-10-CM | POA: Insufficient documentation

## 2023-03-19 DIAGNOSIS — E559 Vitamin D deficiency, unspecified: Secondary | ICD-10-CM | POA: Insufficient documentation

## 2023-03-19 DIAGNOSIS — G4700 Insomnia, unspecified: Secondary | ICD-10-CM | POA: Insufficient documentation

## 2023-03-19 DIAGNOSIS — R5382 Chronic fatigue, unspecified: Secondary | ICD-10-CM | POA: Insufficient documentation

## 2023-03-19 LAB — VITAMIN B12: VITAMIN B 12: 283 pg/mL (ref 180–914)

## 2023-03-19 LAB — COMPREHENSIVE METABOLIC PNL, FASTING
ALBUMIN/GLOBULIN RATIO: 1.4 (ref 0.8–1.4)
ALBUMIN: 4.6 g/dL (ref 3.5–5.7)
ALKALINE PHOSPHATASE: 58 U/L (ref 34–104)
ALT (SGPT): 22 U/L (ref 7–52)
ANION GAP: 9 mmol/L (ref 4–13)
AST (SGOT): 22 U/L (ref 13–39)
BILIRUBIN TOTAL: 0.3 mg/dL (ref 0.3–1.0)
BUN/CREA RATIO: 14 (ref 6–22)
BUN: 12 mg/dL (ref 7–25)
CALCIUM, CORRECTED: 9.2 mg/dL (ref 8.9–10.8)
CALCIUM: 9.7 mg/dL (ref 8.6–10.3)
CHLORIDE: 104 mmol/L (ref 98–107)
CO2 TOTAL: 27 mmol/L (ref 21–31)
CREATININE: 0.85 mg/dL (ref 0.60–1.30)
ESTIMATED GFR: 88 mL/min/{1.73_m2} (ref >59)
GLOBULIN: 3.2 (ref 2.9–5.4)
GLUCOSE: 83 mg/dL (ref 74–109)
OSMOLALITY, CALCULATED: 278 mOsm/kg (ref 270–290)
POTASSIUM: 4.2 mmol/L (ref 3.5–5.1)
PROTEIN TOTAL: 7.8 g/dL (ref 6.4–8.9)
SODIUM: 140 mmol/L (ref 136–145)

## 2023-03-19 LAB — MANUAL DIFFERENTIAL
BAND %: 1 % — ABNORMAL LOW (ref 5–11)
BANDS NEUTROPHILS MANUAL: 1
EOSINOPHIL %: 1 % (ref 0–7)
EOSINOPHIL ABSOLUTE: 0.08 10*3/uL (ref 0.00–0.80)
EOSINOPHILS MANUAL: 1
LYMPHOCYTE %: 43 % (ref 25–45)
LYMPHOCYTE ABSOLUTE: 3.53 10*3/uL (ref 1.10–5.00)
LYMPHOCYTES MANUAL: 43
MONOCYTE %: 4 % (ref 0–12)
MONOCYTE ABSOLUTE: 0.33 10*3/uL (ref 0.00–1.30)
MONOCYTES MANUAL: 4
MYELOCYTE %: 1 %
MYELOCYTE ABSOLUTE: 0.08 10*3/uL
MYELOCYTES MANUAL: 1
NEUTROPHIL %: 50 % (ref 40–76)
NEUTROPHIL ABSOLUTE: 4.18 10*3/uL (ref 1.80–8.40)
NEUTROPHILS MANUAL: 50
PLATELET MORPHOLOGY COMMENT: NORMAL
RBC MORPHOLOGY COMMENT: NORMAL
TOTAL CELLS COUNTED [#] IN BLOOD: 100
WBC: 8.2 10*3/uL

## 2023-03-19 LAB — IRON TRANSFERRIN AND TIBC
IRON (TRANSFERRIN) SATURATION: 21 % (ref 15–50)
IRON: 75 ug/dL (ref 50–212)
TOTAL IRON BINDING CAPACITY: 365 ug/dL (ref 250–450)
TRANSFERRIN: 261 mg/dL (ref 203–362)
UIBC: 290 ug/dL (ref 130–375)

## 2023-03-19 LAB — CBC WITH DIFF
HCT: 41.9 % (ref 31.2–41.9)
HGB: 14 g/dL (ref 10.9–14.3)
MCH: 28.9 pg (ref 24.7–32.8)
MCHC: 33.5 g/dL (ref 32.3–35.6)
MCV: 86.2 fL (ref 75.5–95.3)
MPV: 8 fL (ref 7.9–10.8)
PLATELETS: 346 10*3/uL (ref 140–440)
RBC: 4.86 10*6/uL (ref 3.63–4.92)
RDW: 12.6 % (ref 12.3–17.7)
WBC: 8.2 10*3/uL (ref 3.8–11.8)

## 2023-03-19 LAB — URIC ACID: URIC ACID: 4.5 mg/dL (ref 2.3–7.6)

## 2023-03-19 LAB — FOLATE: FOLATE: 12.7 ng/mL (ref 5.9–24.4)

## 2023-03-19 LAB — THYROID STIMULATING HORMONE WITH FREE T4 REFLEX: TSH: 1.483 u[IU]/mL (ref 0.450–5.330)

## 2023-03-19 LAB — VITAMIN D 25 TOTAL: VITAMIN D 25, TOTAL: 42.86 ng/mL (ref 30.00–100.00)

## 2023-03-19 LAB — MAGNESIUM: MAGNESIUM: 2.1 mg/dL (ref 1.9–2.7)

## 2023-03-19 MED ORDER — CYANOCOBALAMIN (VIT B-12) 1,000 MCG/ML INJECTION SOLUTION
1000.0000 ug | INTRAMUSCULAR | 3 refills | Status: DC
Start: 2023-03-19 — End: 2023-09-01

## 2023-03-19 MED ORDER — TEMAZEPAM 7.5 MG CAPSULE
7.5000 mg | ORAL_CAPSULE | Freq: Every evening | ORAL | 0 refills | Status: DC | PRN
Start: 2023-03-19 — End: 2023-06-18

## 2023-03-19 MED ORDER — TOPIRAMATE 50 MG TABLET
50.0000 mg | ORAL_TABLET | Freq: Every day | ORAL | 1 refills | Status: AC
Start: 2023-03-19 — End: ?

## 2023-03-19 NOTE — Nursing Note (Signed)
Patient is here for routine 3 month follow up for medication refills.

## 2023-03-19 NOTE — Progress Notes (Signed)
INTERNAL MEDICINE, BUILDING A  [address]  [place] [address]  Operated by [facility]  History and Physical     Name: Stacy Cantrell MRN:  [id number]   Date: 03/19/2023 Age: 42 y.o.               Follow Up        Reason for Visit: Follow Up 3 Months (Routine ) joint pain/ trouble sleeping/migraines    History of Present Illness  Stacy Cantrell is a 42 y.o. female who is being seen today in the office for follow-up however has complaints of increased  joint pain in her knees RT>LT as well as hands/wrist w stiffness worse in ams. Pt states she does work on her feet 12+ hrs 3-4 days a week so poss related. She has an appt w ortho Dr Alford Highland coming up concerning her RT knee which occ gets hot red swollen. We did do her arthritis testing last oct and all were neg but would like checked again. 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       F/u migraines for which she was placed on topamax 25 mg nightly. Pt states med may of helped a little but still having occ migraine w 1 last week that lasted several days.  She is still having trouble sleeping she thinks due to hot flashes. She was given Ambien but states she wakes up all groggy os does not like the med. ? If can get something diff.    Follow-up blood pressure/hypertension for which he is currently taking Norvasc 5 mg daily.  Blood pressure today is stable at 110/80.  She also has  Raynaud's which the Norvasc has helped.  Currently at this time she denies any chest pain headaches dizziness.   She is also being followed with a cardiologist and had recent noted nuclear stress test and echocardiogram.    Follow-up cholesterol with history of elevation noted in the past however patient currently not on cholesterol medication and last levels in June stable.  She does try and watch her diet but denies getting a lot of activity.      Follow-up iron  deficiency with previous saturation low and patient currently taking multivitamin with iron.  She does have chronic fatigue issues but denies abdominal pain bowel changes blood in the stool.    Follow-up chronic anxiety/depression with patient currently taking Lexapro 20 mg daily along with BuSpar as needed.  At this time anxiety is stable and patient overall is doing well concerning her anxiety and depression.  She denies panic attacks suicidal thoughts ideation.      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     Allergic rhinitis     Anxiety     COVID-19 vaccine series declined     Depression     Gastroenteritis     Headache     Insomnia disorder          Past Surgical History:   Procedure Laterality Date    HX CARPAL TUNNEL RELEASE Right     HX HYSTERECTOMY      VASCULAR SURGERY Right     vein stripped from right leg      Family Medical  History:       Problem Relation (Age of Onset)    Breast Cancer Other    Cervical Cancer Other    Diabetes type II Maternal Grandmother    Elevated Lipids Father    Hypertension (High Blood Pressure) Father, Paternal Grandmother            Social History     Tobacco Use    Smoking status: Former     Current packs/day: 0.00     Average packs/day: 0.5 packs/day for 15.7 years (7.9 ttl pk-yrs)     Types: Cigarettes     Start date: 10/06/1998     Quit date: 06/30/2014     Years since quitting: 8.7    Smokeless tobacco: Never   Vaping Use    Vaping status: Never Used   Substance Use Topics    Alcohol use: Yes     Alcohol/week: 4.0 standard drinks of alcohol     Types: 4 Glasses of wine per week    Drug use: Never     Medication:  amLODIPine (NORVASC) 5 mg Oral Tablet, Take 1 Tablet (5 mg total) by mouth Once a day  ARIPiprazole (ABILIFY) 2 mg Oral Tablet, Take 1 Tablet (2 mg total) by mouth Once a day  cetirizine (ZYRTEC) 10 mg Oral Tablet, Take 1 Tablet (10 mg total) by mouth Once  per day as needed for Allergies  EPINEPHrine 0.3 mg/0.3 mL Injection Auto-Injector, Inject 0.3 mL (0.3 mg total) into the muscle Once, as needed  escitalopram oxalate (LEXAPRO) 20 mg Oral Tablet, Take 1 Tablet (20 mg total) by mouth Once a day  meloxicam (MOBIC) 15 mg Oral Tablet, TAKE 1 TABLET BY MOUTH EVERY DAY AS NEEDED FOR PAIN WITH FOOD  multivitamin-iron-folic acid (CENTRUM) 18-400 mg-mcg Oral Tablet, Take 1 Tablet by mouth Once a day  ondansetron (ZOFRAN ODT) 8 mg Oral Tablet, Rapid Dissolve, Place 1 Tablet (8 mg total) under the tongue Every 8 hours as needed for Nausea/Vomiting  cyanocobalamin (VITAMIN B12) 1,000 mcg/mL Injection Solution, INJECT 1 ML (1,000 MCG TOTAL) UNDER THE SKIN EVERY 30 DAYS  topiramate (TOPAMAX) 25 mg Oral Tablet, Take 1 Tablet (25 mg total) by mouth Once a day  zolpidem (AMBIEN) 5 mg Oral Tablet, Take 1 Tablet (5 mg total) by mouth Every night as needed for Insomnia    No facility-administered medications prior to visit.    Allergies:  Allergies   Allergen Reactions    Codeine Hives/ Urticaria    Diphenhydramine Hcl Hives/ Urticaria    Penicillins Hives/ Urticaria    Diphenhydramine Hives/ Urticaria       Physical Exam:  Vitals:    03/19/23 0850   BP: 110/80   Pulse: 76   SpO2: 100%   Weight: 89.4 kg (197 lb)   Height: 1.6 m (5\' 3" )   BMI: 34.97        Physical Exam  Vitals and nursing note reviewed.   Constitutional:       Appearance: Normal appearance. She is obese.   HENT:      Right Ear: Tympanic membrane normal.      Left Ear: Tympanic membrane normal.      Mouth/Throat:      Mouth: Mucous membranes are moist.      Pharynx: Oropharynx is clear. No posterior oropharyngeal erythema.   Eyes:      Pupils: Pupils are equal, round, and reactive to light.   Cardiovascular:      Rate and Rhythm: Normal  rate and regular rhythm.      Pulses: Normal pulses.   Pulmonary:      Effort: Pulmonary effort is normal.      Breath sounds: Normal breath sounds.   Abdominal:      General: Bowel  sounds are normal.      Palpations: Abdomen is soft.      Tenderness: There is no abdominal tenderness.   Musculoskeletal:         General: No swelling or tenderness. Normal range of motion.      Cervical back: Normal range of motion and neck supple.      Comments: Pain noted on ROM RT knee  No red hot inflammed joints noted   Lymphadenopathy:      Cervical: No cervical adenopathy.   Skin:     General: Skin is warm.      Findings: No lesion or rash.   Neurological:      General: No focal deficit present.      Mental Status: She is alert and oriented to person, place, and time.   Psychiatric:         Mood and Affect: Mood normal.         Behavior: Behavior normal.         Thought Content: Thought content normal.         Judgment: Judgment normal.         Assessment/Plan:  Problem List Items Addressed This Visit       Chronic fatigue    Relevant Orders    CBC/DIFF    IRON TRANSFERRIN AND TIBC    COMPREHENSIVE METABOLIC PNL, FASTING    THYROID STIMULATING HORMONE WITH FREE T4 REFLEX    MAGNESIUM    FOLATE    VITAMIN B12    Insomnia    Relevant Orders    CBC/DIFF    IRON TRANSFERRIN AND TIBC    COMPREHENSIVE METABOLIC PNL, FASTING    THYROID STIMULATING HORMONE WITH FREE T4 REFLEX    MAGNESIUM    Elevated cholesterol    Relevant Orders    CBC/DIFF    IRON TRANSFERRIN AND TIBC    COMPREHENSIVE METABOLIC PNL, FASTING    THYROID STIMULATING HORMONE WITH FREE T4 REFLEX    MAGNESIUM    Vitamin D deficiency    Relevant Orders    VITAMIN D 25 TOTAL    Migraine    Hypertension - Primary    Relevant Orders    CBC/DIFF    IRON TRANSFERRIN AND TIBC    COMPREHENSIVE METABOLIC PNL, FASTING    THYROID STIMULATING HORMONE WITH FREE T4 REFLEX    MAGNESIUM     Other Visit Diagnoses       Breast cancer screening by mammogram        Relevant Orders    MAMMO BILATERAL SCREENING-ADDL VIEWS/BREAST US AS REQ BY RAD    Arthralgia, unspecified joint        Relevant Orders    HEP-2 SUBSTRATE ANTINUCLEAR ANTIBODIES (ANA), SERUM    RHEUMATOID  FACTOR, SERUM    URIC ACID    Joint stiffness        Relevant Orders    HEP-2 SUBSTRATE ANTINUCLEAR ANTIBODIES (ANA), SERUM    RHEUMATOID FACTOR, SERUM    URIC ACID              Labs obtained in office  Increase topamax 50 mg q hs  DC Ambien  Try restoril 7.5mg  1-2 q hs  Mammo ordered  Refuses flu shot  Meds refilled as noted  Continue f/u w  cardiologist as scheduled  Continue f/u w  Neurologist/ortho as scheduled  Follow-up pending lab results otherwise as scheduled    Post-Discharge Follow Up Appointments       Thursday Jun 18, 2023    Return Patient Visit with Eyvonne Mechanic, PA-C at  8:00 AM      Friday Dec 18, 2023    Return Patient Visit with Eyvonne Mechanic, PA-C at  8:30 AM      Internal Medicine, Building A  [facility]  [address]  [place] [address]  [phone]           Seek medical attention for new or worsening symptoms.  Patient has been seen in this clinic within the last 3 years.     Monda Chastain, PA-C          This note was partially created using MModal Fluency Direct system (voice recognition software) and is inherently subject to errors including those of syntax and "sound-alike" substitutions which may escape proofreading.  In such instances, original meaning may be extrapolated by contextual derivation.

## 2023-03-21 LAB — RHEUMATOID FACTOR, SERUM: RHEUMATOID FACTOR: <13 IU/mL (ref <=30)

## 2023-03-23 ENCOUNTER — Telehealth (INDEPENDENT_AMBULATORY_CARE_PROVIDER_SITE_OTHER): Payer: Self-pay | Admitting: Physician Assistant

## 2023-03-23 LAB — HEP-2 SUBSTRATE ANTINUCLEAR ANTIBODIES (ANA), SERUM: ANA INTERPRETATION: NEGATIVE

## 2023-03-23 NOTE — Telephone Encounter (Signed)
Pt called stating she got bit by cat on Sat, wondering if her Tdap would cover that

## 2023-03-24 ENCOUNTER — Encounter (INDEPENDENT_AMBULATORY_CARE_PROVIDER_SITE_OTHER): Payer: Self-pay | Admitting: Physician Assistant

## 2023-03-24 ENCOUNTER — Other Ambulatory Visit (INDEPENDENT_AMBULATORY_CARE_PROVIDER_SITE_OTHER): Payer: Self-pay | Admitting: Physician Assistant

## 2023-03-24 ENCOUNTER — Ambulatory Visit: Payer: Commercial Managed Care - PPO | Attending: Physician Assistant | Admitting: Physician Assistant

## 2023-03-24 ENCOUNTER — Other Ambulatory Visit: Payer: Self-pay

## 2023-03-24 VITALS — BP 112/82 | Ht 63.0 in | Wt 197.0 lb

## 2023-03-24 DIAGNOSIS — W5501XA Bitten by cat, initial encounter: Secondary | ICD-10-CM | POA: Insufficient documentation

## 2023-03-24 DIAGNOSIS — Z23 Encounter for immunization: Secondary | ICD-10-CM | POA: Insufficient documentation

## 2023-03-24 MED ORDER — CLARITHROMYCIN 500 MG TABLET
500.0000 mg | ORAL_TABLET | Freq: Two times a day (BID) | ORAL | 0 refills | Status: AC
Start: 2023-03-24 — End: 2023-03-31

## 2023-03-24 NOTE — Nursing Note (Signed)
Patient is here for a cat bite.

## 2023-03-24 NOTE — Progress Notes (Signed)
INTERNAL MEDICINE, BUILDING A  [address]  [place] [address]  Operated by [facility]  Progress Note    Name: Stacy Cantrell MRN:  [id number]   Date: 03/24/2023 DOB:  1981/05/30 (42 y.o.)              Chief Complaint: Cat bite       HPI: Stacy Cantrell is a 42 y.o. female who complains of  cat bite to her left arm on sat. Bite received after accidentally rolling onto her house cat while on the couch. Pt states the cat has been fully vaccinated however bite did break the skin. She now has a bruise noted around the bite but no redness warmth fever lymphadenopathy. Pt has not had a tetanus shot in several years so agreeable today.    Allergies:  Allergies   Allergen Reactions    Codeine Hives/ Urticaria    Diphenhydramine Hcl Hives/ Urticaria    Penicillins Hives/ Urticaria    Diphenhydramine Hives/ Urticaria       Current Medications:  amLODIPine (NORVASC) 5 mg Oral Tablet, Take 1 Tablet (5 mg total) by mouth Once a day  ARIPiprazole (ABILIFY) 2 mg Oral Tablet, Take 1 Tablet (2 mg total) by mouth Once a day  cetirizine (ZYRTEC) 10 mg Oral Tablet, Take 1 Tablet (10 mg total) by mouth Once per day as needed for Allergies  cyanocobalamin (VITAMIN B12) 1,000 mcg/mL Injection Solution, Inject 1 mL (1,000 mcg total) under the skin Every 30 days  EPINEPHrine 0.3 mg/0.3 mL Injection Auto-Injector, Inject 0.3 mL (0.3 mg total) into the muscle Once, as needed  escitalopram oxalate (LEXAPRO) 20 mg Oral Tablet, Take 1 Tablet (20 mg total) by mouth Once a day  meloxicam (MOBIC) 15 mg Oral Tablet, TAKE 1 TABLET BY MOUTH EVERY DAY AS NEEDED FOR PAIN WITH FOOD  multivitamin-iron-folic acid (CENTRUM) 18-400 mg-mcg Oral Tablet, Take 1 Tablet by mouth Once a day  ondansetron (ZOFRAN ODT) 8 mg Oral Tablet, Rapid Dissolve, Place 1 Tablet (8 mg total) under the tongue Every 8 hours as needed for Nausea/Vomiting  temazepam (RESTORIL) 7.5 mg Oral Capsule, Take 1 Capsule (7.5 mg total) by mouth Every  night as needed for Insomnia  topiramate (TOPAMAX) 50 mg Oral Tablet, Take 1 Tablet (50 mg total) by mouth Once a day    No facility-administered medications prior to visit.       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     Allergic rhinitis     Anxiety     COVID-19 vaccine series declined     Depression     Gastroenteritis     Headache     Insomnia disorder            Social History     Tobacco Use    Smoking status: Former     Current packs/day: 0.00     Average packs/day: 0.5 packs/day for 15.7 years (7.9 ttl pk-yrs)     Types: Cigarettes     Start date: 10/06/1998     Quit date: 06/30/2014     Years since quitting: 8.7    Smokeless tobacco: Never   Vaping Use    Vaping status: Never Used   Substance Use Topics    Alcohol use: Yes     Alcohol/week: 4.0 standard drinks of alcohol     Types: 4 Glasses of wine per week  Drug use: Never       OBJECTIVE:  Vitals:    03/24/23 0921   BP: 112/82   SpO2: 100%   Weight: 89.4 kg (197 lb)   Height: 1.6 m (5\' 3" )   BMI: 34.97      Physical Exam  Nursing note reviewed.   Constitutional:       Appearance: Normal appearance.   Lymphadenopathy:      Cervical: No cervical adenopathy.   Skin:     Comments: +large bruise approx softball size noted left post mid humerus w 2 puncture wounds noted center of bruise however no induration warmth however slight redness w tenderness on palp noted   Neurological:      Mental Status: She is alert.        Assessment & Plan  Cat bite  Biaxin 500 mg bid x 7 days    Need for Tdap vaccination  Tdap given       PLAN:     Orders Placed This Encounter    Tdap Vaccine (Admin)    clarithromycin (BIAXIN) 500 mg Oral Tablet      Post-Discharge Follow Up Appointments       Monday May 04, 2023    Appointment at [facility] [place] at 12:30 PM      Thursday Jun 18, 2023    Return Patient Visit with Eyvonne Mechanic, PA-C at  8:00 AM      Friday Dec 18, 2023    Return Patient Visit with  Eyvonne Mechanic, PA-C at  8:30 AM      Internal Medicine, Building A  [address], [place]  [address]  [place] [address]  [phone]             This note was partially created using MModal Fluency Direct system (voice recognition software) and is inherently subject to errors including those of syntax and "sound-alike" substitutions which may escape proofreading.  In such instances, original meaning may be extrapolated by contextual derivation.    Kenna Seward, PA-C

## 2023-04-21 IMAGING — MR MRI KNEE LT W/O CONTRAST
5 series · 40 of 40 positions shown · IV contrast (gadolinium)
Comparison: MRI dated 03/06/2022.

﻿EXAM:  91918   MRI KNEE LT W/O CONTRAST
INDICATION: Left knee pain and swelling over the medial aspect. Diminished range of motion.  No history of knee surgery.
TECHNIQUE: Multiplanar, multisequential MRI of the left knee was performed without gadolinium contrast.

[Series 6: PD fat-sat · sagittal · left · 3.8mm · 0.47mm/px · 9 of 29 slices shown (1 of 2)]
[im 1/29]
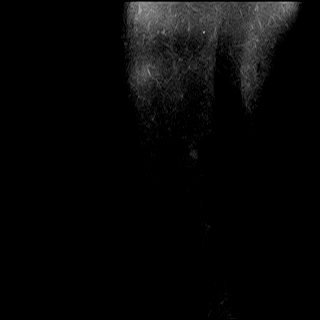
[im 4/29]
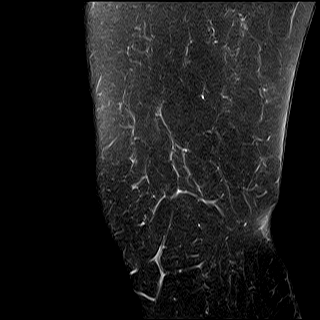
[im 8/29]
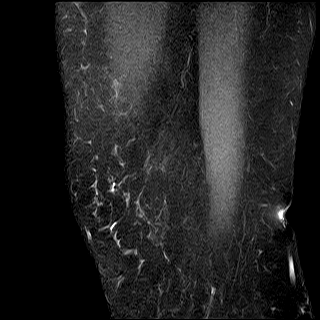
[im 11/29]
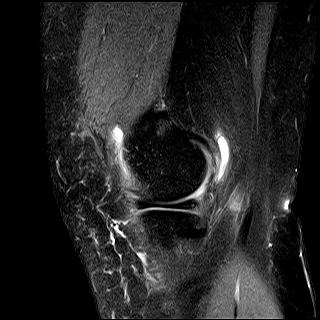
[im 15/29]
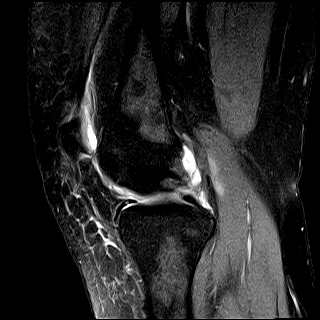
[im 18/29]
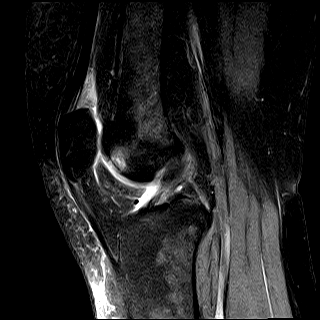
[im 22/29]
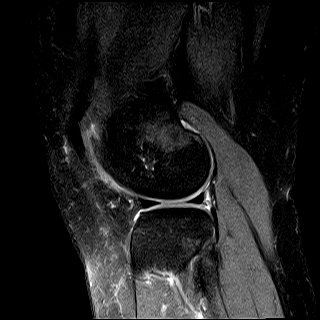
[im 25/29]
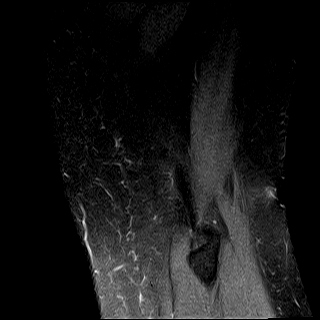
[im 29/29]
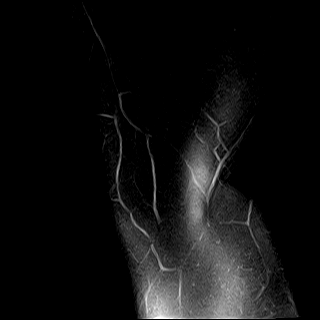

[Series 7: T1 · sagittal · left · 3.8mm · 0.39mm/px · 8 of 29 slices shown]
[im 1/29]
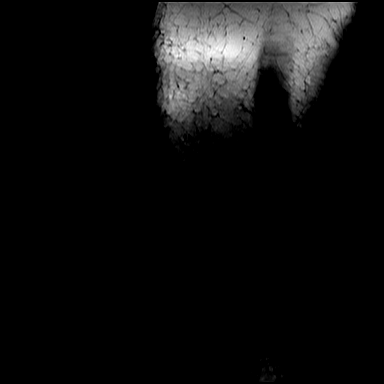
[im 5/29]
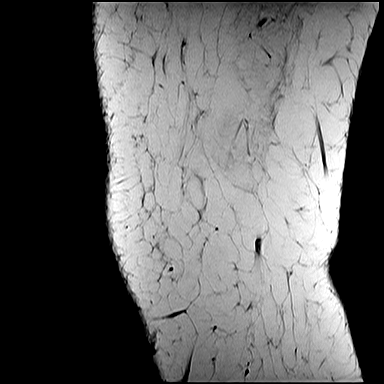
[im 9/29]
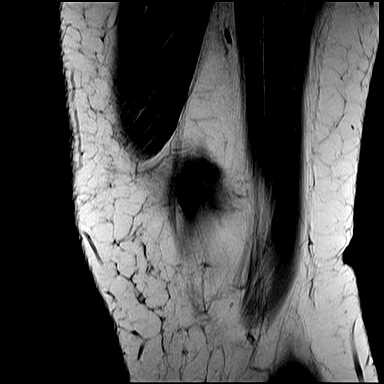
[im 13/29]
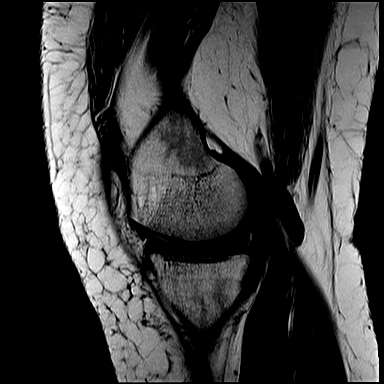
[im 17/29]
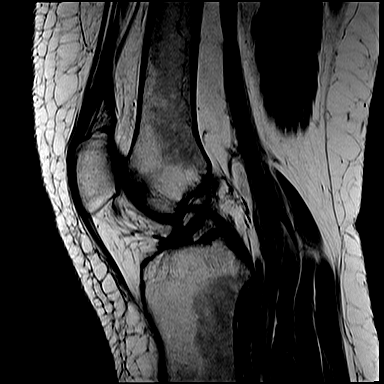
[im 21/29]
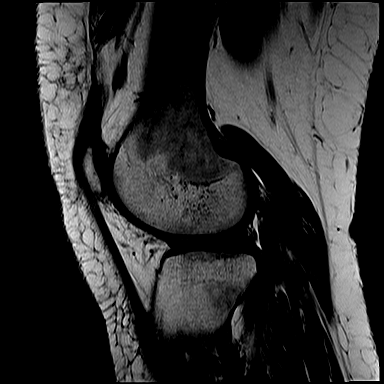
[im 25/29]
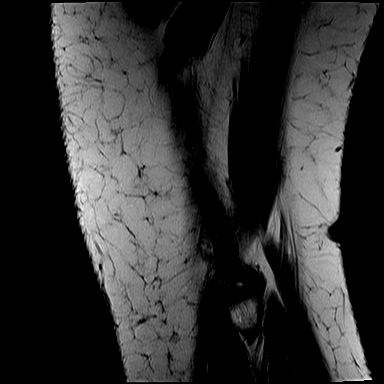
[im 29/29]
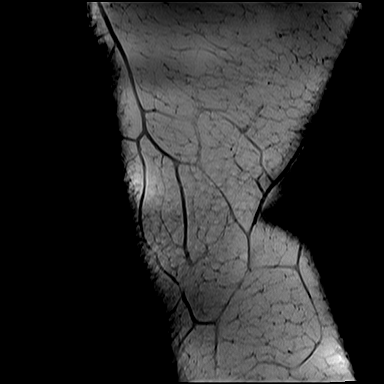

[Series 8: STIR · coronal · left · 4.5mm · 0.47mm/px · 7 of 25 slices shown]
[im 1/25]
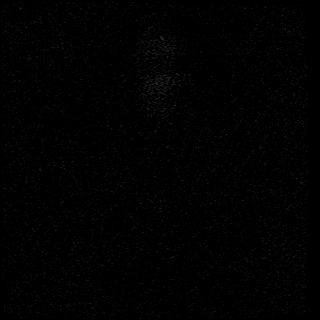
[im 5/25]
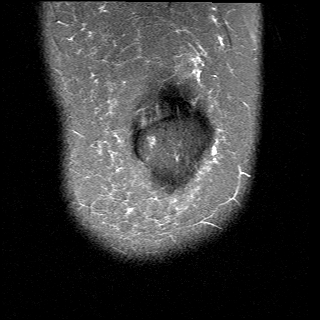
[im 9/25]
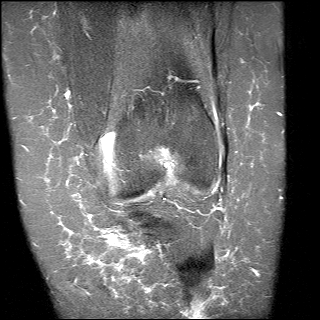
[im 13/25]
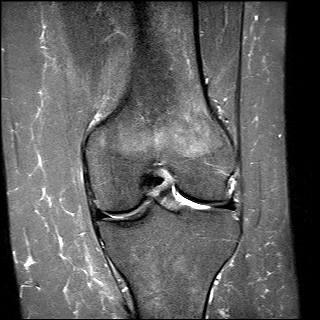
[im 17/25]
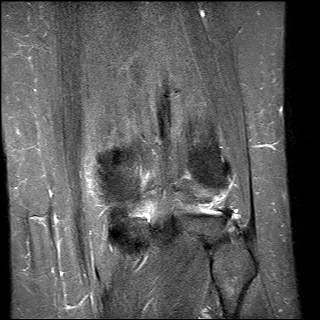
[im 21/25]
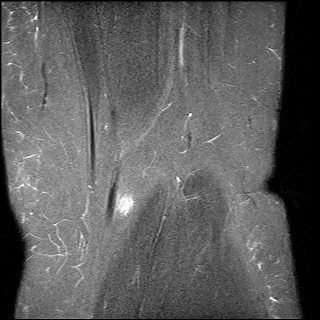
[im 25/25]
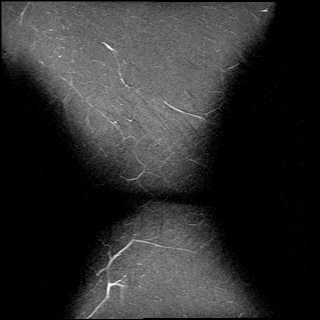

[Series 9: PD fat-sat · coronal · left · 4.5mm · 0.47mm/px · 7 of 25 slices shown (2 of 2)]
[im 1/25]
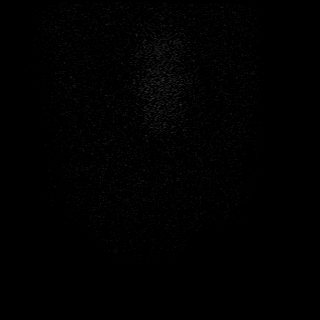
[im 5/25]
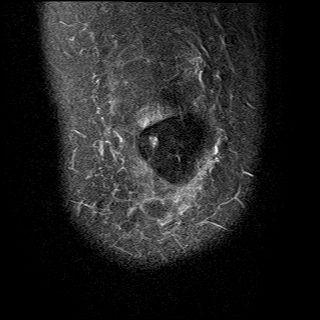
[im 9/25]
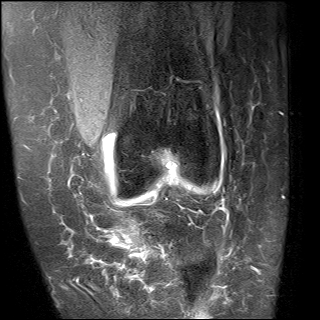
[im 13/25]
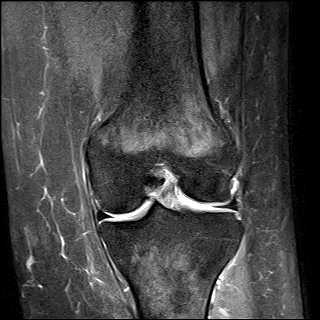
[im 17/25]
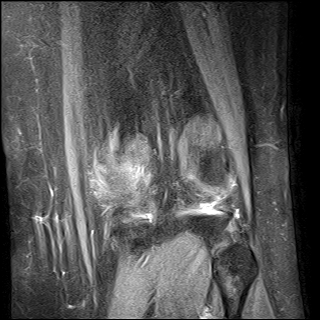
[im 21/25]
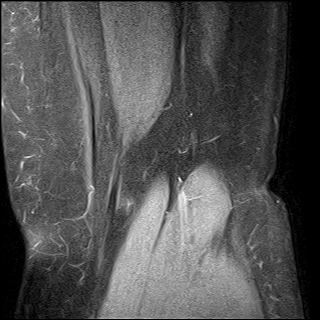
[im 25/25]
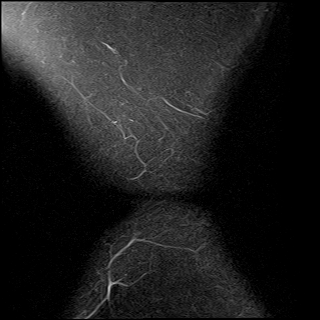

[Series 10: PD · axial · left · 4.5mm · 0.53mm/px · z∈[-80,+65]mm · 9 of 30 slices shown]
[im 1/30]
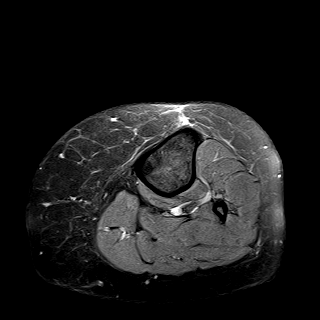
[im 4/30]
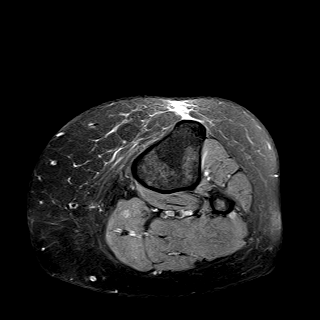
[im 8/30]
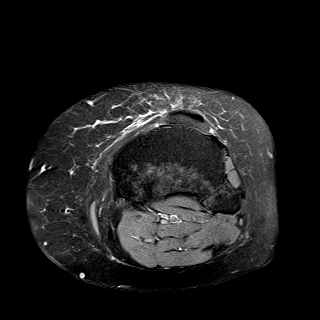
[im 11/30]
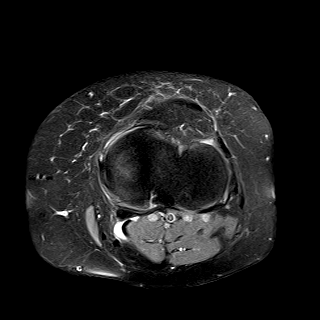
[im 15/30]
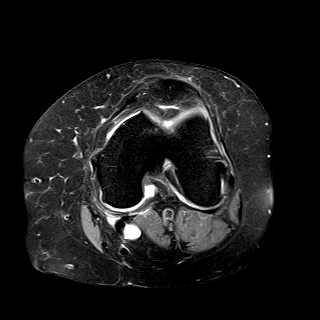
[im 19/30]
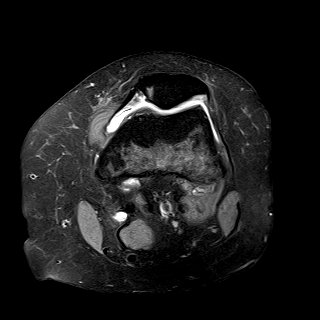
[im 22/30]
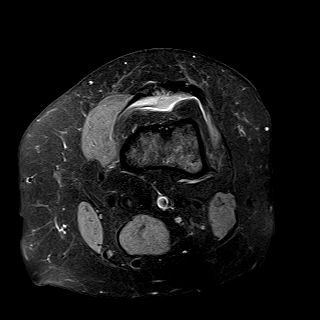
[im 26/30]
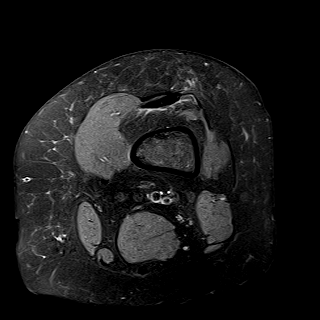
[im 30/30]
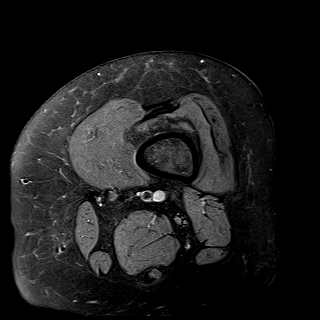

[40 of 40 positions shown; findings below may reference images not displayed]

FINDINGS: No acute bony lesions are seen at the left knee.  Focal area of osteochondral defect of the anterior aspect of lateral femoral condyle is noted with subarticular bone marrow edema near the intercondylar notch, similar to prior study.

Significant grade 3 degenerative changes of patellofemoral articular cartilage are noted with subchondral cyst formation.

The medial meniscus and lateral meniscus do not show any focal lesions.  Anterior and posterior cruciate ligaments are intact.  Collateral ligaments are intact.  Quadriceps tendon and patellar tendon are intact.

Small effusion in the knee joint.  A 2 cm size Baker's cyst is noted in the medial aspect of popliteal fossa.  Prepatellar, infrapatellar bursitis is noted.
IMPRESSION: 1. Focal osteochondral defect with subchondral edema of the anterior aspect of lateral femoral condyle near the intercondylar notch.  No loose fragments are seen.

2. Significant grade 3 degenerative changes of patellofemoral articulation with small subchondral ganglion cyst formation.

3. Infrapatellar bursitis.

4. Small effusion in the knee joint with Baker's cyst in the popliteal fossa.  

Electronically Signed by OSULLIVAN, LEVY at 22-[id number]-TATI [DATE]

## 2023-05-01 NOTE — Progress Notes (Signed)
[facility]  [address]  [place] [address]  [phone]  Colman Cater, MD  05/01/2023    Office: FOLLOW UP KNEE    Visit type: Office, followup    Referred by: No ref. provider found    Injury date: no specific injury    CHIEF COMPLAINT:  bilateral knee pain    HISTORY OF PRESENT ILLNESS:  42 y/o female presents with bilateral knee pain, swelling, mechanical symptoms, and dysfunction that began in October 2023 without specific inciting event.  Patient notes a slow and steady increase in pain and mechanical symptoms especially going up and down stairs and while at work.  This prompted her to come in for evaluation.  Of note she has seen me previously for her left knee with associated patellofemoral chondromalacia that has done well with conservative treatment, specifically home exercise.  No overt numbness tingling.  Minimal subjective instability.  Moderate clicking, catching, or locking.  She has attempted other conservative measures, including activity modification, rest, ice, heat, Advil, and a knee brace.  She denies any history of right knee surgery.    08/08/2022: She underwent a right knee corticosteroid injection by me which gave her approximately 3 months of relief.  She presents back today requesting repeat injection in the right knee and a subsequent injection in the left knee.  She notes over the last few months the left knee has become more problematic again.  She has predominantly anterior knee pain and sometimes posterior knee pain on the left.    12/10/22: She presents back today requesting repeat corticosteroid injections.  This gave her approximately 4 months of relief.  She still works at [employer].  She has been doing her stretching exercises.    03/23/23: Since that time her knee was functioning at her baseline when she sustained an injury on 02/15/2023.  She was walking went to turn and felt a pop in her knee.  She was not wearing her brace.  She noted bruising and swelling about  the knee.  Her right knee symptoms have decreased over time but she also began to develop left knee pain and swelling as well.  She presents today for reevaluation for both knees.    Since that time her left knee has become more symptomatic.  She underwent a repeat MRI and presents back to go over the results.  She continues to have predominant anterior knee pain.  She is having mechanical symptoms with this.    Reviewed EPIC past medical history, past surgical history, medications, allergies, family history, and social history as below:    Past Medical History:   she  has a past medical history of Anxiety disorder, Carpal tunnel syndrome, COVID-19 virus infection, Depression, Endometriosis, Head ache, Hypertension, Hypoglycemia, Joint pain, Low back pain, Migraine, No family history of adverse response to anesthesia, Ovarian cyst, Panic attack, Polycystic ovaries, Snoring, and Strain of lumbar paraspinal muscle.    Past Surgical History:   she  has a past surgical history that includes endometrial ablation (2015); hx tubal ligation (Bilateral, 05/2003); removal gallbladder (2007); hx vein stripping (2017); pelvic examination w anesth (N/A, 12/17/2020); laparoscopy w total hyst,250gram or less (Bilateral, 12/17/2020); sling oper stres incontinence (N/A, 12/17/2020); cystourethroscopy (N/A, 12/17/2020); lap,rmv  adnexal structure (Bilateral, 12/17/2020); and revise ulnar nerve at elbow (Right, 05/20/2022).     Meds:    Current Outpatient Medications:   .  ARIPiprazole (ABILIFY) 2 mg Tablet, take 2 mg by mouth ., Disp: ,  Rfl:   .  ketorolac (TORADOL) 10 mg Tablet, take 1 tablet every 8 (eight) hours by mouth  Do not take with other NSAIDs. You may resume home NSAID medications after completing this prescription.., Disp: 20 tablet, Rfl: 0  .  cyanocobalamin (VITAMIN B12) 1,000 mcg/mL Solution, 1,000 mcg as directed by Subcutaneous route ., Disp: , Rfl:   .  Calcium, take 1 mEq every day by mouth  PT TAKES 1 GUMMY ONCE DAILY  W/CALCIUM AND B3. ., Disp: , Rfl:   .  amLODIPine (NORVASC) 5 mg Tablet, take 5 mg every day by mouth ., Disp: , Rfl:   .  Levocetirizine 5 mg Tablet, take 5 mg every day by mouth ., Disp: , Rfl:   .  escitalopram oxalate (LEXAPRO) 10 mg Tablet, take 10 mg every day by mouth ., Disp: , Rfl:   .  multivitamin rowell oral capsule Capsule, take 1 capsule in the morning by mouth., Disp: , Rfl:     Allergies:   Allergies   Allergen Reactions   . Benadryl [Diphenhydramine Hcl] Hives   . Codeine Hives   . Penicillins Hives        Family History:   her family history includes Diabetes in her maternal grandmother; Elevated Lipids in her father; Hypertension in her father and paternal grandmother.     Social History:   she  reports that she quit smoking about 9 years ago. Her smoking use included cigarettes. She has a 8 pack-year smoking history. She has never been exposed to tobacco smoke. She has never used smokeless tobacco. She reports current alcohol use of about 2.0 standard drinks of alcohol per week. She reports that she does not use drugs.      Review of Systems:   Complete 10-point ROS reviewed per patient provided intake handout. Pertinent ROS per HPI as above and as follows:   Constitutional - no weight loss, fever, chills  ENT - no gross hearing impairment  Gastrointestinal - no nausea, vomiting, diarrhea, ulcer  Integumentary - no visible skin rashes  Hematologic/Lymphatic - no lymphedema, lymphadenopathy, bruising, bleeding  Eyes - no gross visual impairment  Cardiovascular - no chest pain or SOB   Musculoskeletal - as stated above  Neurologic - no dizziness, syncope, weakness  Respiratory - no cough, asthma, or other respiratory issues  Psychiatric - no depression, agitation, memory disturbance    PHYSICAL EXAMINATION:  General: Well-developed, well-nourished female in no apparent distress. Pleasant, appropriate, and oriented.   Ht Readings from Last 1 Encounters:   04/01/23 1.6 m (5\' 3" )     Wt Readings from  Last 1 Encounters:   04/01/23 86.2 kg (190 lb)     Temp Readings from Last 1 Encounters:   05/20/22 97.5 F (36.4 C)     Pulse Readings from Last 1 Encounters:   05/20/22 89       ENT: Negative for hearing impairment.  Respiratory: Normal respiratory effort.  Psychiatric: Normal mood and affect.    Right lower extremity:  No effusion  Knee ROM 0-135  1A Lachman, negative posterior drawer  Stable to varus and valgus forces at 0 and 30  Mild medial joint line tenderness, no lateral joint line tenderness  Positive patellofemoral crepitus, positive grind, no patellar apprehension with laterally directed force  Sensation intact to light touch, leg warm and well perfused    Left lower extremity:  Trace effusion  Knee ROM 0-135  1A Lachman, negative posterior drawer  Stable  to varus and valgus forces at 0 and 30  Moderate medial joint line tenderness, no lateral joint line tenderness  No patellofemoral crepitus, mildly positive grind, no patellar apprehension with laterally directed force  Sensation intact to light touch, leg warm and well perfused    IMAGING STUDIES: X-rays of the right knee were independently reviewed by me and demonstrates maintained joint spaces, no OCD's, no acute fracture or dislocation.    X-rays of the right knee obtained 03/23/2023 were independently reviewed by me and demonstrates maintained joint spaces, no OCD's, no interval arthritic progression, no fracture or dislocation.    X-rays of the left knee obtained 03/23/2023 were independently reviewed by me and demonstrates maintained joint spaces, no OCD's, no fracture or dislocation.    MRI of the left knee found on [facility] was independently reviewed by me which demonstrates focal osteochondral defect with associated subchondral edema of the patella and lateral aspect of the central trochlea.  Largely unchanged from previous MRI.    DIAGNOSIS:   Encounter Diagnoses   Name Primary?   . Patellofemoral chondrosis of left knee Yes   . Acquired defect  of articular cartilage        ASSESSMENT & PLAN:    Patient visit today with Bruchy consisted of a review of history, exam , and imaging findings. The patient demonstrates evidence of bilateral knee patellofemoral chondromalacia with possible meniscal pathology.  Specifically regarding her left knee she has symptomatic chondral flaps of the patella and trochlea.  I discussed the repeat MRI at length with the patient.  She is having diminishing returns from her corticosteroid injections.  She would like to proceed with surgical intervention.  This with the understanding that she has failed conservative treatment. On this basis surgical intervention was offered in the form of left knee arthroscopic shaving chondroplasty. Risks and benefits of surgery discussed to include infection, blood loss, damage to neurovascular structures, anterior knee pain, anterior knee numbness, knee stiffness, continued knee instability, and need for future or revision operations.  We also discussed that this could be a staging procedure for a future cartilage restoration procedure depending on the overall architecture of her patellofemoral joint.  After discussing these risks and benefits patient elected to proceed with surgery. We will also have the patient take a baby aspirin 81 mg twice a day for 14 days after surgery for DVT prophylaxis. The patient is amenable to the plan.  All questions were answered.    Colman Cater, MD  05/01/2023  2:38 PM

## 2023-06-03 ENCOUNTER — Ambulatory Visit: Payer: Commercial Managed Care - PPO | Attending: Physician Assistant | Admitting: Physician Assistant

## 2023-06-03 ENCOUNTER — Encounter (INDEPENDENT_AMBULATORY_CARE_PROVIDER_SITE_OTHER): Payer: Self-pay | Admitting: Physician Assistant

## 2023-06-03 ENCOUNTER — Other Ambulatory Visit: Payer: Self-pay

## 2023-06-03 VITALS — BP 112/72 | HR 93 | Temp 97.7°F | Ht 63.0 in | Wt 211.0 lb

## 2023-06-03 DIAGNOSIS — Z01810 Encounter for preprocedural cardiovascular examination: Secondary | ICD-10-CM

## 2023-06-03 DIAGNOSIS — M2242 Chondromalacia patellae, left knee: Secondary | ICD-10-CM | POA: Insufficient documentation

## 2023-06-03 DIAGNOSIS — M222X2 Patellofemoral disorders, left knee: Secondary | ICD-10-CM

## 2023-06-03 DIAGNOSIS — M2410 Other articular cartilage disorders, unspecified site: Secondary | ICD-10-CM | POA: Insufficient documentation

## 2023-06-03 DIAGNOSIS — Z01818 Encounter for other preprocedural examination: Secondary | ICD-10-CM | POA: Insufficient documentation

## 2023-06-03 NOTE — Progress Notes (Signed)
[facility]  [address]  [place] [address]  Operated by [facility]  History and Physical    Name: Stacy Cantrell MRN:  [id number]   Date: 06/03/2023 DOB:  May 27, 1981 (42 y.o.)             PCP: Eyvonne Mechanic, PA-C     Reason for Visit: Pre-op (Pre op)    History of Present Illness  Stacy Cantrell is a 42 y.o. female who is being seen today for preoperative optimization.     Patient is scheduled to have  left knee arthroscopic shaving chondroplasty  performed by Dr Alford Highland on 06/08/23.   This surgery is considered to be Low/Med/High: Low risk for cardiac events.  Functional capacity is estimated at EXCELLENT: (greater than 7 METs), move heavy furniture, scrub floors    - Patient denies any of the following Major Clinical Predictors:  Unstable coronary artery disease ( Recent MI, unstable angina),  Decompensated heart failure, Significant arrythmias, or Severe valvular disease.   - Patient denies any of the following Intermediate Clinical Predictors:  Mild angina pectoris, Prior MI, Compensated heart failure, Diabetes Mellitus, or Renal insufficency.   - Patient denies the following Minor Clinical Predictors:  Abormal ECG, History of rhythm other than sinus, Low functional capacity, History of stroke, or Uncontrolled systemic hypertension.      She does not currently smoke.   She does consume alcohol socially  She does not use recreational drugs.   She does use caffeine. Amount:  1-2 energy drinks daily    She has had anesthesia in the past and has not had an anesthesia reaction.       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     Allergic rhinitis     Anxiety     COVID-19 vaccine series declined     Depression     Gastroenteritis     Headache     Insomnia disorder          Past Surgical History:   Procedure Laterality Date    HX CARPAL TUNNEL RELEASE Right     HX HYSTERECTOMY      VASCULAR SURGERY Right      vein stripped from right leg         Medication:  amLODIPine (NORVASC) 5 mg Oral Tablet, Take 1 Tablet (5 mg total) by mouth Once a day  ARIPiprazole (ABILIFY) 2 mg Oral Tablet, TAKE 1 TABLET BY MOUTH EVERY DAY  cetirizine (ZYRTEC) 10 mg Oral Tablet, TAKE 1 TABLET BY MOUTH EVERY DAY AS NEEDED  cyanocobalamin (VITAMIN B12) 1,000 mcg/mL Injection Solution, Inject 1 mL (1,000 mcg total) under the skin Every 30 days  EPINEPHrine 0.3 mg/0.3 mL Injection Auto-Injector, Inject 0.3 mL (0.3 mg total) into the muscle Once, as needed  escitalopram oxalate (LEXAPRO) 20 mg Oral Tablet, TAKE 1 TABLET BY MOUTH ONCE A DAY  escitalopram oxalate (LEXAPRO) 20 mg Oral Tablet, TAKE 1 TABLET BY MOUTH ONCE A DAY  meloxicam (MOBIC) 15 mg Oral Tablet, TAKE 1 TABLET BY MOUTH EVERY DAY AS NEEDED FOR PAIN WITH FOOD  multivitamin-iron-folic acid (CENTRUM) 18-400 mg-mcg Oral Tablet, Take 1 Tablet by mouth Once a day  ondansetron (ZOFRAN ODT) 8 mg Oral Tablet, Rapid Dissolve, Place 1 Tablet (8 mg total) under the tongue Every 8 hours as needed for Nausea/Vomiting  temazepam (RESTORIL) 7.5 mg Oral Capsule, Take 1 Capsule (7.5 mg  total) by mouth Every night as needed for Insomnia  topiramate (TOPAMAX) 50 mg Oral Tablet, Take 1 Tablet (50 mg total) by mouth Once a day    No facility-administered medications prior to visit.    Allergies:  Allergies   Allergen Reactions    Codeine Hives/ Urticaria    Diphenhydramine Hcl Hives/ Urticaria    Penicillins Hives/ Urticaria    Diphenhydramine Hives/ Urticaria       Family Medical History:       Problem Relation (Age of Onset)    Breast Cancer Other    Cervical Cancer Other    Diabetes type II Maternal Grandmother    Elevated Lipids Father    Hypertension (High Blood Pressure) Father, Paternal Grandmother            Social History     Tobacco Use    Smoking status: Former     Current packs/day: 0.00     Average packs/day: 0.5 packs/day for 15.7 years (7.9 ttl pk-yrs)     Types: Cigarettes     Start date:  10/06/1998     Quit date: 06/30/2014     Years since quitting: 8.9    Smokeless tobacco: Never   Vaping Use    Vaping status: Never Used   Substance Use Topics    Alcohol use: Yes     Alcohol/week: 4.0 standard drinks of alcohol     Types: 4 Glasses of wine per week    Drug use: Never     Social History     Social History Narrative    Not on file       There are no exam notes on file for this visit.    Physical Exam:  Vitals:    06/03/23 1511   BP: 112/72   Pulse: 93   Temp: 36.5 C (97.7 F)   SpO2: 95%   Weight: 95.7 kg (211 lb)   Height: 1.6 m (5\' 3" )   BMI: 37.38         Physical Exam  Vitals and nursing note reviewed.   Constitutional:       Appearance: Normal appearance. She is obese.   HENT:      Right Ear: Tympanic membrane normal.      Left Ear: Tympanic membrane normal.      Mouth/Throat:      Mouth: Mucous membranes are moist.      Pharynx: Oropharynx is clear. No posterior oropharyngeal erythema.   Eyes:      Pupils: Pupils are equal, round, and reactive to light.   Cardiovascular:      Rate and Rhythm: Normal rate and regular rhythm.      Pulses: Normal pulses.   Pulmonary:      Effort: Pulmonary effort is normal.      Breath sounds: Normal breath sounds.   Abdominal:      General: Bowel sounds are normal.      Palpations: Abdomen is soft.      Tenderness: There is no abdominal tenderness.   Musculoskeletal:         General: No swelling or tenderness. Normal range of motion.      Cervical back: Normal range of motion and neck supple.      Comments: Pain noted on ROM LT knee w flexion  +tenderness medial compartment  Gait stable  Lymphadenopathy:      Cervical: No cervical adenopathy.   Skin:     General: Skin is warm.  Findings: No lesion or rash.   Neurological:      General: No focal deficit present.      Mental Status: She is alert and oriented to person, place, and time.   Psychiatric:         Mood and Affect: Mood normal.         Behavior: Behavior normal.         Thought Content: Thought content  normal.         Judgment: Judgment normal.       Assessment & Plan  Pre-op examination  Pt cleared for upcoming surgery based on today H&P  Patellofemoral chondrosis of left knee    Acquired defect of articular cartilage       Orders Placed This Encounter    93000 - ECG In Clinic ([facility] [employer])          Perioperative Risk Stratification  Patient is scheduled to have left knee arthroscopic shaving chondroplasty performed by Dr. Alford Highland on 06/08/23.    Given that surgery is considered to be Low for Cardiac Events, Functional Capacity is estimated as excellent patient is considered to be of Low risk for this Low  risk procedure according to the 2014 ACC Guidelines.     12 Lead EKG  was ordered today.         Follow up:   Post-Discharge Follow Up Appointments       Thursday Jun 18, 2023    Return Patient Visit with Eyvonne Mechanic, PA-C at  8:00 AM      Tuesday Sep 01, 2023    Return Patient Visit with Eyvonne Mechanic, PA-C at  2:00 PM      Friday Dec 18, 2023    Return Patient Visit with Eyvonne Mechanic, PA-C at  8:30 AM      [facility]  [facility]  [address]  [place] [address]  [phone]           Seek medical attention for new or worsening symptoms.  Patient has been seen in this clinic within the last 3 years.     Stacy Lavin, PA-C          This note was partially created using MModal Fluency Direct system (voice recognition software) and is inherently subject to errors including those of syntax and "sound-alike" substitutions which may escape proofreading.  In such instances, original meaning may be extrapolated by contextual derivation.

## 2023-06-18 ENCOUNTER — Encounter (INDEPENDENT_AMBULATORY_CARE_PROVIDER_SITE_OTHER): Payer: Self-pay | Admitting: Physician Assistant

## 2023-06-18 ENCOUNTER — Other Ambulatory Visit: Payer: Self-pay

## 2023-06-18 ENCOUNTER — Ambulatory Visit: Payer: Commercial Managed Care - PPO | Attending: Physician Assistant | Admitting: Physician Assistant

## 2023-06-18 VITALS — BP 114/72 | HR 92 | Ht 63.0 in | Wt 198.0 lb

## 2023-06-18 DIAGNOSIS — R197 Diarrhea, unspecified: Secondary | ICD-10-CM | POA: Insufficient documentation

## 2023-06-18 DIAGNOSIS — G4700 Insomnia, unspecified: Secondary | ICD-10-CM | POA: Insufficient documentation

## 2023-06-18 DIAGNOSIS — F32A Depression, unspecified: Secondary | ICD-10-CM | POA: Insufficient documentation

## 2023-06-18 DIAGNOSIS — I10 Essential (primary) hypertension: Secondary | ICD-10-CM | POA: Insufficient documentation

## 2023-06-18 DIAGNOSIS — F419 Anxiety disorder, unspecified: Secondary | ICD-10-CM | POA: Insufficient documentation

## 2023-06-18 MED ORDER — LACTOBACILLUS ACIDOPHILUS 75 MILLION CELL-PECTIN 100 MG CAPSULE
1.0000 | ORAL_CAPSULE | Freq: Every day | ORAL | 2 refills | Status: DC
Start: 2023-06-18 — End: 2023-07-08

## 2023-06-18 MED ORDER — TEMAZEPAM 7.5 MG CAPSULE
7.5000 mg | ORAL_CAPSULE | Freq: Every evening | ORAL | 0 refills | Status: DC | PRN
Start: 2023-06-18 — End: 2023-08-03

## 2023-06-18 MED ORDER — ARIPIPRAZOLE 2 MG TABLET
2.0000 mg | ORAL_TABLET | Freq: Every day | ORAL | 1 refills | Status: DC
Start: 2023-06-18 — End: 2023-09-01

## 2023-06-18 NOTE — Nursing Note (Signed)
Patient is here for routine 3 month follow up for medication refills.

## 2023-06-18 NOTE — Progress Notes (Signed)
INTERNAL MEDICINE, BUILDING A  [address]  [place] [address]  Operated by [facility]  History and Physical     Name: Stacy Cantrell MRN:  [id number]   Date: 06/18/2023 Age: 42 y.o.               Follow Up        Reason for Visit: Follow Up 3 Months diarrhea x 7 days    History of Present Illness  Stacy Cantrell is a 42 y.o. female who is being seen today in the office for follow-up however has complaints of diarrhea now x 7 days. No abd pain n/v/cramping or blood noted. Pt states she may have 2-3 episodes a day and also reports prior hx of GB removal. She did just have knee surgery and has an upcoming female surgery so ? If nerves could have something to do w it because she does not feel sick.    Follow-up blood pressure/hypertension for which she is currently taking Norvasc 5 mg daily.  Blood pressure today is stable at 114/72.  She also has  Raynaud's which the Norvasc has helped.  Currently at this time she denies any chest pain headaches dizziness.   She is also being followed with a cardiologist w most recent workup a nuclear stress test and echocardiogram all obtained and noted to be stable.    Follow-up chronic anxiety/depression with patient currently taking Lexapro 20 mg daily and Abilify 2mg  daily.  She denies panic attacks or new concerns but once again has a lot going on so anxiety may be a little worse lately.  She also suffers from insomnia for which she is using restoril 7.5mg  q hs which does help. Request med refills today.    She also has hx of migraines for which she has been seen by neurologist in the past. Headaches stable at this time on current meds.    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     Allergic rhinitis     Anxiety     COVID-19 vaccine series declined     Depression     Gastroenteritis     Headache     Insomnia disorder          Past  Surgical History:   Procedure Laterality Date    HX CARPAL TUNNEL RELEASE Right     HX HYSTERECTOMY      VASCULAR SURGERY Right     vein stripped from right leg      Family Medical History:       Problem Relation (Age of Onset)    Breast Cancer Other    Cervical Cancer Other    Diabetes type II Maternal Grandmother    Elevated Lipids Father    Hypertension (High Blood Pressure) Father, Paternal Grandmother            Social History     Tobacco Use    Smoking status: Former     Current packs/day: 0.00     Average packs/day: 0.5 packs/day for 15.7 years (7.9 ttl pk-yrs)     Types: Cigarettes     Start date: 10/06/1998     Quit date: 06/30/2014     Years since quitting: 8.9    Smokeless tobacco: Never   Vaping Use    Vaping status: Never  Used   Substance Use Topics    Alcohol use: Yes     Alcohol/week: 4.0 standard drinks of alcohol     Types: 4 Glasses of wine per week    Drug use: Never     Medication:  amLODIPine (NORVASC) 5 mg Oral Tablet, Take 1 Tablet (5 mg total) by mouth Once a day  cetirizine (ZYRTEC) 10 mg Oral Tablet, TAKE 1 TABLET BY MOUTH EVERY DAY AS NEEDED  cyanocobalamin (VITAMIN B12) 1,000 mcg/mL Injection Solution, Inject 1 mL (1,000 mcg total) under the skin Every 30 days  EPINEPHrine 0.3 mg/0.3 mL Injection Auto-Injector, Inject 0.3 mL (0.3 mg total) into the muscle Once, as needed  escitalopram oxalate (LEXAPRO) 20 mg Oral Tablet, TAKE 1 TABLET BY MOUTH ONCE A DAY  meloxicam (MOBIC) 15 mg Oral Tablet, TAKE 1 TABLET BY MOUTH EVERY DAY AS NEEDED FOR PAIN WITH FOOD  multivitamin-iron-folic acid (CENTRUM) 18-400 mg-mcg Oral Tablet, Take 1 Tablet by mouth Once a day  ondansetron (ZOFRAN ODT) 8 mg Oral Tablet, Rapid Dissolve, Place 1 Tablet (8 mg total) under the tongue Every 8 hours as needed for Nausea/Vomiting  topiramate (TOPAMAX) 50 mg Oral Tablet, Take 1 Tablet (50 mg total) by mouth Once a day  ARIPiprazole (ABILIFY) 2 mg Oral Tablet, TAKE 1 TABLET BY MOUTH EVERY DAY  escitalopram oxalate (LEXAPRO) 20  mg Oral Tablet, TAKE 1 TABLET BY MOUTH ONCE A DAY  temazepam (RESTORIL) 7.5 mg Oral Capsule, Take 1 Capsule (7.5 mg total) by mouth Every night as needed for Insomnia    No facility-administered medications prior to visit.    Allergies:  Allergies   Allergen Reactions    Codeine Hives/ Urticaria    Diphenhydramine Hcl Hives/ Urticaria    Penicillins Hives/ Urticaria    Diphenhydramine Hives/ Urticaria       Physical Exam:  Vitals:    06/18/23 0805   BP: 114/72   Pulse: 92   SpO2: 99%   Weight: 89.8 kg (198 lb)   Height: 1.6 m (5\' 3" )   BMI: 35.07        Physical Exam  Vitals and nursing note reviewed.   Constitutional:       Appearance: Normal appearance. She is obese.   HENT:      Right Ear: Tympanic membrane normal.      Left Ear: Tympanic membrane normal.      Mouth/Throat:      Mouth: Mucous membranes are moist.      Pharynx: Oropharynx is clear. No posterior oropharyngeal erythema.   Eyes:      Pupils: Pupils are equal, round, and reactive to light.   Cardiovascular:      Rate and Rhythm: Normal rate and regular rhythm.      Pulses: Normal pulses.   Pulmonary:      Effort: Pulmonary effort is normal.      Breath sounds: Normal breath sounds.   Abdominal:      General: Bowel sounds are normal. There is no distension.      Palpations: Abdomen is soft.      Tenderness: There is no abdominal tenderness.   Musculoskeletal:      Cervical back: Normal range of motion and neck supple.      Comments: Pt on crutches today  Ace on  LT knee s/p surgery  No red hot inflammed joints noted   Lymphadenopathy:      Cervical: No cervical adenopathy.   Skin:     General: Skin  is warm.      Findings: No rash.   Neurological:      General: No focal deficit present.      Mental Status: She is alert and oriented to person, place, and time.   Psychiatric:         Mood and Affect: Mood normal.         Behavior: Behavior normal.         Thought Content: Thought content normal.         Judgment: Judgment normal.          Assessment/Plan:  Problem List Items Addressed This Visit       Anxiety    Depression    Insomnia    Hypertension     Other Visit Diagnoses       Diarrhea, unspecified type    -  Primary          Labs up to date at this time  Probiotic daily given  Encouraged to eat yogurt /bland foods and increase electrolytes  Meds refilled as noted  Continue f/u w  cardiologist as scheduled  Continue f/u w  Neurologist/ortho as scheduled    Post-Discharge Follow Up Appointments       Tuesday Sep 01, 2023    Return Patient Visit with Eyvonne Mechanic, PA-C at  2:00 PM      Friday Dec 18, 2023    Return Patient Visit with Eyvonne Mechanic, PA-C at  8:30 AM      Internal Medicine, Building A  [facility]  [address]  [place] [address]  [phone]         Seek medical attention for new or worsening symptoms.  Patient has been seen in this clinic within the last 3 years.     Stacy Elson, PA-C          This note was partially created using MModal Fluency Direct system (voice recognition software) and is inherently subject to errors including those of syntax and "sound-alike" substitutions which may escape proofreading.  In such instances, original meaning may be extrapolated by contextual derivation.

## 2023-06-26 NOTE — Progress Notes (Signed)
[facility]  [address]  [place] [address]  [phone]  Clinton Quant, PA-C  06/26/2023      Date of Surgery: 06/08/2023    Procedure: Left knee arthroscopic shaving chondroplasty, Left knee arthroscopic medial plica excision    History:  Stacy Cantrell returns following surgery of the left knee.  The patient is progressing well at this time. She complains of mild pain which is intermittent. She denies numbness. She is not  taking the narcotic pain medication. She is having some stiffness and reduced ROM.     Left Knee Exam:  Reveals the portal sites to be healing well without drainage.  ROM 3-110.  Stable to varus valgus forces    Images: Arthroscopy images reviewed patient    Impression: Stacy Cantrell is s/p the above procedure. Our treatment plan is as follows:    Doing well.  Sutures removed    Education:  Clinical findings and imaging reviewed with the patient.    Activity Modifications:  Discussed avoidance of painful activities.  Activity restrictions: Per meniscectomy discharge instructions    Physical Therapy:  Discussed physical / occupational therapy.    Prescription: Patient was given formalized physical therapy to work on range of motion and strength      Bracing:  Discussed bracing/orthotics.  None    Medications / Injections:  PMP reviewed  Discussed necessity for medications.  OTC medications     Red Flags:   Discussed "red flag" symptoms that should prompt a call to the office (for example persistent or increasing pain despite treatment, fever, constitutional complaints, night pain, severe pain, etc.).    Follow-up: Return 4 weeks.      Clinton Quant, PA-C  06/26/2023  7:56 AM

## 2023-07-03 NOTE — Progress Notes (Signed)
Received via: Alford Highland  Form type: FMLA  Paperwork from: Lindie Spruce  When completed the form will be faxed to [facility] and picked up by patient  Action: Signature obtain forwarded to patients forms.  Provider: Alford Highland

## 2023-07-07 ENCOUNTER — Ambulatory Visit: Payer: Commercial Managed Care - PPO | Attending: OBSTETRICS/GYNECOLOGY

## 2023-07-07 ENCOUNTER — Other Ambulatory Visit: Payer: Self-pay

## 2023-07-07 DIAGNOSIS — Z01818 Encounter for other preprocedural examination: Secondary | ICD-10-CM | POA: Insufficient documentation

## 2023-07-07 LAB — CBC
HCT: 44.2 % — ABNORMAL HIGH (ref 31.2–41.9)
HGB: 15.4 g/dL — ABNORMAL HIGH (ref 10.9–14.3)
MCH: 29.4 pg (ref 24.7–32.8)
MCHC: 34.8 g/dL (ref 32.3–35.6)
MCV: 84.3 fL (ref 75.5–95.3)
MPV: 6.7 fL — ABNORMAL LOW (ref 7.9–10.8)
PLATELETS: 306 10*3/uL (ref 140–440)
RBC: 5.24 10*6/uL — ABNORMAL HIGH (ref 3.63–4.92)
RDW: 12.4 % (ref 12.3–17.7)
WBC: 9.4 10*3/uL (ref 3.8–11.8)

## 2023-07-08 ENCOUNTER — Encounter (HOSPITAL_COMMUNITY)
Admission: RE | Disposition: A | Discharge status: Home / Self Care | Payer: Self-pay | Source: Ambulatory Visit | Attending: OBSTETRICS/GYNECOLOGY

## 2023-07-08 ENCOUNTER — Ambulatory Visit (HOSPITAL_COMMUNITY): Payer: Commercial Managed Care - PPO | Admitting: Anesthesiology

## 2023-07-08 ENCOUNTER — Ambulatory Visit
Admission: RE | Admit: 2023-07-08 | Discharge: 2023-07-08 | Disposition: A | Discharge status: Home / Self Care | Payer: Commercial Managed Care - PPO | Source: Ambulatory Visit | Attending: OBSTETRICS/GYNECOLOGY | Admitting: OBSTETRICS/GYNECOLOGY

## 2023-07-08 ENCOUNTER — Encounter (HOSPITAL_COMMUNITY): Payer: Self-pay | Admitting: OBSTETRICS/GYNECOLOGY

## 2023-07-08 ENCOUNTER — Ambulatory Visit (HOSPITAL_COMMUNITY): Payer: Commercial Managed Care - PPO | Admitting: OBSTETRICS/GYNECOLOGY

## 2023-07-08 DIAGNOSIS — I10 Essential (primary) hypertension: Secondary | ICD-10-CM | POA: Insufficient documentation

## 2023-07-08 DIAGNOSIS — F32A Depression, unspecified: Secondary | ICD-10-CM | POA: Insufficient documentation

## 2023-07-08 DIAGNOSIS — F419 Anxiety disorder, unspecified: Secondary | ICD-10-CM | POA: Insufficient documentation

## 2023-07-08 DIAGNOSIS — N8311 Corpus luteum cyst of right ovary: Secondary | ICD-10-CM | POA: Insufficient documentation

## 2023-07-08 DIAGNOSIS — Z6835 Body mass index (BMI) 35.0-35.9, adult: Secondary | ICD-10-CM | POA: Insufficient documentation

## 2023-07-08 DIAGNOSIS — N80329 Endometriosis of the posterior cul-de-sac, unspecified depth: Secondary | ICD-10-CM | POA: Insufficient documentation

## 2023-07-08 DIAGNOSIS — G43909 Migraine, unspecified, not intractable, without status migrainosus: Secondary | ICD-10-CM | POA: Insufficient documentation

## 2023-07-08 DIAGNOSIS — R1031 Right lower quadrant pain: Secondary | ICD-10-CM | POA: Insufficient documentation

## 2023-07-08 DIAGNOSIS — E669 Obesity, unspecified: Secondary | ICD-10-CM | POA: Insufficient documentation

## 2023-07-08 SURGERY — LAPAROSCOPIC BILATERAL SALPINGO OOPHORECTOMY
Anesthesia: General | Site: Vagina | Laterality: Right | Wound class: Clean Contaminated Wounds-The respiratory, GI, Genital, or urinary

## 2023-07-08 MED ORDER — KETOROLAC 30 MG/ML (1 ML) INJECTION SOLUTION
INTRAMUSCULAR | Status: AC
Start: 2023-07-08 — End: 2023-07-08
  Filled 2023-07-08: qty 1

## 2023-07-08 MED ORDER — IPRATROPIUM 0.5 MG-ALBUTEROL 3 MG (2.5 MG BASE)/3 ML NEBULIZATION SOLN
3.0000 mL | INHALATION_SOLUTION | Freq: Once | RESPIRATORY_TRACT | Status: DC | PRN
Start: 2023-07-08 — End: 2023-07-08

## 2023-07-08 MED ORDER — IBUPROFEN 600 MG TABLET
600.0000 mg | ORAL_TABLET | Freq: Four times a day (QID) | ORAL | Status: DC | PRN
Start: 2023-07-08 — End: 2023-07-08
  Filled 2023-07-08: qty 1

## 2023-07-08 MED ORDER — FENTANYL (PF) 50 MCG/ML INJECTION SOLUTION
INTRAMUSCULAR | Status: AC
Start: 2023-07-08 — End: 2023-07-08
  Filled 2023-07-08: qty 2

## 2023-07-08 MED ORDER — OXYCODONE-ACETAMINOPHEN 5 MG-325 MG TABLET
1.0000 | ORAL_TABLET | ORAL | Status: DC | PRN
Start: 2023-07-08 — End: 2023-07-08
  Administered 2023-07-08: 1 via ORAL
  Filled 2023-07-08: qty 1

## 2023-07-08 MED ORDER — FENTANYL (PF) 50 MCG/ML INJECTION WRAPPER
25.0000 ug | INJECTION | INTRAMUSCULAR | Status: DC | PRN
Start: 2023-07-08 — End: 2023-07-08
  Administered 2023-07-08: 25 ug via INTRAVENOUS

## 2023-07-08 MED ORDER — ONDANSETRON 8 MG DISINTEGRATING TABLET
8.0000 mg | ORAL_TABLET | Freq: Three times a day (TID) | ORAL | 5 refills | Status: DC | PRN
Start: 2023-07-08 — End: 2024-05-15

## 2023-07-08 MED ORDER — LACTATED RINGERS INTRAVENOUS SOLUTION
INTRAVENOUS | Status: DC
Start: 2023-07-08 — End: 2023-07-08

## 2023-07-08 MED ORDER — LEVOFLOXACIN 750 MG/150 ML IN 5 % DEXTROSE INTRAVENOUS PIGGYBACK
INJECTION | INTRAVENOUS | Status: AC
Start: 2023-07-08 — End: 2023-07-08
  Filled 2023-07-08: qty 150

## 2023-07-08 MED ORDER — SODIUM CHLORIDE 0.9 % (FLUSH) INJECTION SYRINGE
3.0000 mL | INJECTION | Freq: Three times a day (TID) | INTRAMUSCULAR | Status: DC
Start: 2023-07-08 — End: 2023-07-08

## 2023-07-08 MED ORDER — MIDAZOLAM 5 MG/ML INJECTION WRAPPER
2.0000 mg | Freq: Once | INTRAMUSCULAR | Status: DC | PRN
Start: 2023-07-08 — End: 2023-07-08
  Administered 2023-07-08: 2 mg via INTRAVENOUS

## 2023-07-08 MED ORDER — ONDANSETRON HCL (PF) 4 MG/2 ML INJECTION SOLUTION
4.0000 mg | Freq: Once | INTRAMUSCULAR | Status: DC | PRN
Start: 2023-07-08 — End: 2023-07-08

## 2023-07-08 MED ORDER — FENTANYL (PF) 50 MCG/ML INJECTION WRAPPER
INJECTION | Freq: Once | INTRAMUSCULAR | Status: DC | PRN
Start: 2023-07-08 — End: 2023-07-08
  Administered 2023-07-08: 100 ug via INTRAVENOUS

## 2023-07-08 MED ORDER — DEXAMETHASONE SODIUM PHOSPHATE 4 MG/ML INJECTION SOLUTION
INTRAMUSCULAR | Status: AC
Start: 2023-07-08 — End: 2023-07-08
  Filled 2023-07-08: qty 1

## 2023-07-08 MED ORDER — IBUPROFEN 800 MG TABLET: 800.0000 mg | ORAL_TABLET | Freq: Three times a day (TID) | ORAL | 5 refills | Status: DC | PRN

## 2023-07-08 MED ORDER — PROCHLORPERAZINE EDISYLATE 10 MG/2 ML (5 MG/ML) INJECTION SOLUTION
5.0000 mg | Freq: Once | INTRAMUSCULAR | Status: DC | PRN
Start: 2023-07-08 — End: 2023-07-08

## 2023-07-08 MED ORDER — CLINDAMYCIN 900 MG/50 ML IN 0.9% SODIUM CHLORIDE INTRAVENOUS PIGGYBACK
INJECTION | INTRAVENOUS | Status: AC
Start: 2023-07-08 — End: 2023-07-08
  Filled 2023-07-08: qty 50

## 2023-07-08 MED ORDER — LIDOCAINE (PF) 100 MG/5 ML (2 %) INTRAVENOUS SYRINGE
INJECTION | Freq: Once | INTRAVENOUS | Status: DC | PRN
Start: 2023-07-08 — End: 2023-07-08
  Administered 2023-07-08 (×2): 100 mg via INTRAVENOUS

## 2023-07-08 MED ORDER — ROCURONIUM 10 MG/ML INTRAVENOUS SOLUTION
Freq: Once | INTRAVENOUS | Status: DC | PRN
Start: 2023-07-08 — End: 2023-07-08
  Administered 2023-07-08: 100 mg via INTRAVENOUS

## 2023-07-08 MED ORDER — SODIUM CHLORIDE 0.9 % (FLUSH) INJECTION SYRINGE
3.0000 mL | INJECTION | INTRAMUSCULAR | Status: DC | PRN
Start: 2023-07-08 — End: 2023-07-08

## 2023-07-08 MED ORDER — MIDAZOLAM 5 MG/ML INJECTION WRAPPER
INTRAMUSCULAR | Status: AC
Start: 2023-07-08 — End: 2023-07-08
  Filled 2023-07-08: qty 1

## 2023-07-08 MED ORDER — ONDANSETRON HCL (PF) 4 MG/2 ML INJECTION SOLUTION
4.0000 mg | Freq: Four times a day (QID) | INTRAMUSCULAR | Status: DC | PRN
Start: 2023-07-08 — End: 2023-07-08

## 2023-07-08 MED ORDER — PHENYLEPHRINE 10 MG/ML INJECTION SOLUTION
Freq: Once | INTRAMUSCULAR | Status: DC | PRN
Start: 2023-07-08 — End: 2023-07-08
  Administered 2023-07-08: 100 ug via INTRAVENOUS
  Administered 2023-07-08: 200 ug via INTRAVENOUS

## 2023-07-08 MED ORDER — OXYCODONE-ACETAMINOPHEN 7.5 MG-325 MG TABLET
1.0000 | ORAL_TABLET | Freq: Four times a day (QID) | ORAL | 0 refills | Status: DC | PRN
Start: 2023-07-08 — End: 2024-03-09

## 2023-07-08 MED ORDER — ALBUTEROL SULFATE 2.5 MG/3 ML (0.083 %) SOLUTION FOR NEBULIZATION
2.5000 mg | INHALATION_SOLUTION | Freq: Once | RESPIRATORY_TRACT | Status: DC | PRN
Start: 2023-07-08 — End: 2023-07-08

## 2023-07-08 MED ORDER — PROPOFOL 10 MG/ML IV BOLUS
INJECTION | Freq: Once | INTRAVENOUS | Status: DC | PRN
Start: 2023-07-08 — End: 2023-07-08
  Administered 2023-07-08: 200 mg via INTRAVENOUS

## 2023-07-08 MED ORDER — ALUMINUM-MAG HYDROXIDE-SIMETHICONE 200 MG-200 MG-20 MG/5 ML ORAL SUSP
30.0000 mL | Freq: Four times a day (QID) | ORAL | Status: DC | PRN
Start: 2023-07-08 — End: 2023-07-08

## 2023-07-08 MED ORDER — FAMOTIDINE (PF) 20 MG/2 ML INTRAVENOUS SOLUTION
INTRAVENOUS | Status: AC
Start: 2023-07-08 — End: 2023-07-08
  Filled 2023-07-08: qty 2

## 2023-07-08 MED ORDER — ONDANSETRON HCL (PF) 4 MG/2 ML INJECTION SOLUTION
INTRAMUSCULAR | Status: AC
Start: 2023-07-08 — End: 2023-07-08
  Filled 2023-07-08: qty 2

## 2023-07-08 MED ORDER — FAMOTIDINE (PF) 20 MG/2 ML INTRAVENOUS SOLUTION
20.0000 mg | Freq: Once | INTRAVENOUS | Status: AC
Start: 2023-07-08 — End: 2023-07-08
  Administered 2023-07-08: 20 mg via INTRAVENOUS

## 2023-07-08 MED ORDER — FENTANYL (PF) 50 MCG/ML INJECTION WRAPPER
50.0000 ug | INJECTION | INTRAMUSCULAR | Status: DC | PRN
Start: 2023-07-08 — End: 2023-07-08

## 2023-07-08 MED ORDER — ONDANSETRON HCL (PF) 4 MG/2 ML INJECTION SOLUTION
4.0000 mg | Freq: Once | INTRAMUSCULAR | Status: AC
Start: 2023-07-08 — End: 2023-07-08
  Administered 2023-07-08: 4 mg via INTRAVENOUS

## 2023-07-08 MED ORDER — HYDROMORPHONE 2 MG/ML INJECTION WRAPPER
2.0000 mg | INJECTION | INTRAMUSCULAR | Status: DC | PRN
Start: 2023-07-08 — End: 2023-07-08

## 2023-07-08 MED ORDER — KETOROLAC 30 MG/ML (1 ML) INJECTION SOLUTION
30.0000 mg | Freq: Four times a day (QID) | INTRAMUSCULAR | Status: DC | PRN
Start: 2023-07-08 — End: 2023-07-08
  Administered 2023-07-08: 30 mg via INTRAVENOUS

## 2023-07-08 MED ORDER — BUPIVACAINE HCL 0.25 % (2.5 MG/ML) INJECTION SOLUTION
Freq: Once | INTRAMUSCULAR | Status: DC | PRN
Start: 2023-07-08 — End: 2023-07-08
  Administered 2023-07-08: 20 mL via INTRAMUSCULAR

## 2023-07-08 MED ORDER — CLINDAMYCIN 900 MG/50 ML IN 0.9% SODIUM CHLORIDE INTRAVENOUS PIGGYBACK
900.0000 mg | INJECTION | Freq: Once | INTRAVENOUS | Status: AC
Start: 2023-07-08 — End: 2023-07-08
  Administered 2023-07-08: 900 mg via INTRAVENOUS

## 2023-07-08 MED ORDER — DEXAMETHASONE SODIUM PHOSPHATE 4 MG/ML INJECTION SOLUTION
4.0000 mg | Freq: Once | INTRAMUSCULAR | Status: AC
Start: 2023-07-08 — End: 2023-07-08
  Administered 2023-07-08: 4 mg via INTRAVENOUS

## 2023-07-08 MED ORDER — MORPHINE 2 MG/ML INJECTION WRAPPER
2.0000 mg | INJECTION | INTRAMUSCULAR | Status: DC | PRN
Start: 2023-07-08 — End: 2023-07-08

## 2023-07-08 MED ORDER — LEVOFLOXACIN 500 MG/100 ML IN 5 % DEXTROSE INTRAVENOUS PIGGYBACK
500.0000 mg | INJECTION | Freq: Once | INTRAVENOUS | Status: AC
Start: 2023-07-08 — End: 2023-07-08
  Administered 2023-07-08: 750 mg via INTRAVENOUS

## 2023-07-08 MED ORDER — PROMETHAZINE 25 MG TABLET
25.0000 mg | ORAL_TABLET | Freq: Four times a day (QID) | ORAL | Status: DC | PRN
Start: 2023-07-08 — End: 2023-07-08

## 2023-07-08 MED ORDER — ONDANSETRON HCL (PF) 4 MG/2 ML INJECTION SOLUTION
Freq: Once | INTRAMUSCULAR | Status: DC | PRN
Start: 2023-07-08 — End: 2023-07-08
  Administered 2023-07-08: 4 mg via INTRAVENOUS

## 2023-07-08 MED ORDER — SUGAMMADEX 100 MG/ML INTRAVENOUS SOLUTION
Freq: Once | INTRAVENOUS | Status: DC | PRN
Start: 2023-07-08 — End: 2023-07-08
  Administered 2023-07-08: 400 mg via INTRAVENOUS

## 2023-07-08 SURGICAL SUPPLY — 36 items
ADH LIQUID LF  WTPRF VIAL PREP NONSTAIN MASTISOL STYRAX GUM MASTIC ALC MTHY SLCYT STRL CLR NHZR 2/3 (WOUND CARE SUPPLY) ×1 IMPLANT
BLADE 11 2 END CBNSTL SURG STRL DISP (SURGICAL CUTTING SUPPLIES) ×2 IMPLANT
BLADE SURG CLPR W 37.2MM GP EXIST HNDL GTT IN CHRG .23MM NONST LF  DISP (MED SURG SUPPLIES) ×1 IMPLANT
CANNULA LAPSCP 5MM 100MM VERSAONE STD UNIV FIX BLDLS DLPHN NOSE TIP LOW PROF STRL LF  DISP (ENDOSCOPIC SUPPLIES) ×1 IMPLANT
CATH URETH CLEAN 14FR 16IN UNIV INTMT SMOOTH TIP FNL END STGR EYE VNYL STRL LF  CLR (UROLOGICAL SUPPLIES) ×1 IMPLANT
CONV USE ITEM 107364 - COVER STAND 54X23IN MAYO REG FBRC REINF STD TLSCP FOLD STRL (DRAPE/PACKS/SHEETS/OR TOWEL) ×1 IMPLANT
COUNTER 20 CNT BLOCK ADH NEEDLE STRL LF  RD SHARP FOAM 15.75X11.5X14IN DISP (MED SURG SUPPLIES) ×1 IMPLANT
COVER EQP 90X60IN HVDTY BCK PAD FNFLD BLU STRL (DRAPE/PACKS/SHEETS/OR TOWEL) ×1 IMPLANT
COVER STAND 54X23IN MAYO REG FBRC REINF STD TLSCP FOLD STRL (DRAPE/PACKS/SHEETS/OR TOWEL) ×1
DETERGENT INSTR 22OZ TRNSPT GEL RINSE FREE NEUT PH PREKLENZ CLR PLSNT LF (MISCELLANEOUS PT CARE ITEMS) ×1 IMPLANT
DRAPE SURG KC100 LAP CHOLE REINF ARMBRD CVR HKLP TUBE HLDR FEN 104X76X120IN STD BSC STRL (DRAPE/PACKS/SHEETS/OR TOWEL) ×1 IMPLANT
ELECTRODE HANDPIECE 5MMX37CM LIGASURE XP ELCTRSRG ENDO (SURGICAL CUTTING SUPPLIES) ×1 IMPLANT
GLOVE SURG 7 LF  PF BEAD CUF STRL CRM 11.8IN PROTEXIS PI PLISPRN THK9.1 MIL (GLOVES AND ACCESSORIES) ×2 IMPLANT
GLOVE SURG 7.5 LF  PF BEAD CUF STRL CRM 11.8IN PROTEXIS PI PLISPRN THK9.1 MIL (GLOVES AND ACCESSORIES) ×1 IMPLANT
GLOVE SURG 8.5 LF  PF BEAD CUF STRL CRM 11.8IN PROTEXIS PI PLISPRN THK9.1 MIL (GLOVES AND ACCESSORIES) ×1 IMPLANT
GOWN SURG LRG STD LGTH L3 HKLP CLSR RGLN SLEEVE TWL STRL LF  DISP GRN AERO BLU PRFRM FBRC (DRAPE/PACKS/SHEETS/OR TOWEL) ×1 IMPLANT
GOWN SURG XL STD LGTH L3 HKLP CLSR RGLN SLEEVE TWL STRL LF  DISP GRN AERO BLU PRFRM FBRC (DRAPE/PACKS/SHEETS/OR TOWEL) ×1 IMPLANT
LABEL MED CORRECT MED LABELING SYS 4 FLG 2 SHEET 24 PRPRNT STRL (MED SURG SUPPLIES) ×1 IMPLANT
NEEDLE HYPO  18GA 1.5IN REG WL BD PRCSNGL POLYPROP REG BVL LL HUB CLR CD DEHP-FR STRL LF  DISP (MED SURG SUPPLIES) ×1 IMPLANT
NEEDLE HYPO  21GA 1.5IN TW SFGLD POLYPROP REG BVL LL SHIELD MECH DEHP-FR IM GRN STRL LF  DISP (MED SURG SUPPLIES) ×1 IMPLANT
NEEDLE INSFL 120MM 14GA STD SRGNDL PNMPRTN SPRG LD BLUNT STY STRL LF  DISP SS PLASTIC RD (ENDOSCOPIC SUPPLIES) ×1 IMPLANT
PACK SURG OB IV ABDOMINAL DRP ABS TWL LEGGING STRL DISP 60X38IN 22X15IN LF (CUSTOM TRAYS & PACK) ×1 IMPLANT
SET TUBING PNEUMOCLEAR HIFLO SMOKE EVAC (MED SURG SUPPLIES) ×1 IMPLANT
SOL ANFG DFGR ISOPRPNL PAD OVAL BTL NABRSV ADH STRL LF  DISP (ENDOSCOPIC SUPPLIES) ×1 IMPLANT
SOL IRRG 0.9% NACL 1000ML PLASTIC PR BTL ISTNC N-PYRG STRL LF (MEDICATIONS/SOLUTIONS) ×1 IMPLANT
SOL PREP 10% PVP IOD 4OZ ANSEP SCREW TOP BTL LIQUID LF  DRK BRN (MED SURG SUPPLIES) ×1 IMPLANT
SPONGE SURG 4X4IN 16 PLY XRY DETECT COTTON STRL LF  DISP (WOUND CARE SUPPLY) ×1 IMPLANT
STRIP 4X.5IN STRSTRP PLSTR REINF SKNCLS WHT STRL LF (WOUND CARE SUPPLY) ×1 IMPLANT
SUTURE 4-0 P-12 POLYSRB SURGALLOY 18IN UNDYED BRD COAT ABS (SUTURE/WOUND CLOSURE) ×1 IMPLANT
SUTURE CHR 3-0 V-20 30IN ABS (SUTURE/WOUND CLOSURE) IMPLANT
SYRINGE LL 10ML LF  STRL GRAD N-PYRG DEHP-FR PVC FREE MED DISP (MED SURG SUPPLIES) ×1 IMPLANT
SYRINGE LL 20ML STRL GRAD MED DISP (MED SURG SUPPLIES) ×1 IMPLANT
TOWEL 24X16IN COTTON BLU DISP SURG STRL LF (DRAPE/PACKS/SHEETS/OR TOWEL) ×2 IMPLANT
TROCAR LAPSCP STD 100MM 5MM VERSAONE FIX CANN BLADE STRL LF  DISP (ENDOSCOPIC SUPPLIES) ×1 IMPLANT
TROCAR LAPSCP STD 100MM 8MM VERSAONE FIX CANN BLDLS DLPHN NOSE TIP STRL LF  DISP (ENDOSCOPIC SUPPLIES) ×1 IMPLANT
WATER STRL 1000ML PLASTIC PR BTL LF (MED SURG SUPPLIES) ×1 IMPLANT

## 2023-07-08 NOTE — OR Surgeon (Signed)
[place] [facility]    OPERATIVE REPORT      [facility] [employer]    Patient Name:  Stacy Cantrell [facility] Number:  [id number]  Date of Service:  07/08/23  Date of birth:  [birth year]    PREOPERATIVE DIAGNOSES:  RIGHT LOWER QUADRANT PAIN; ENDOMETRIOSIS    POSTOPERATIVE DIAGNOSIS:  RIGHT LOWER QUADRANT PAIN; ENDOMETRIOSIS    PROCEDURE:  LAPAROSCOPIC RIGHT OOPHORECTOMY; LYSIS OF ADHESIONS, COAGULATION OF ENDOMETRIOSIS: 54098 (CPT)   SURGEON:  Ashley Royalty, MD   ANESTHESIA STAFF:  Anesthesiologist: Cornett, Mayme Genta, MD  CRNA: Bobette Mo, CRNA   ANESTHESIA TYPE:General   COMPLICATIONS:  None.    ESTIMATED BLOOD LOSS:  Than 3 cc  FLUIDS:  Crystalloid.     FINDINGS:  Laparoscopic findings showed the patient's uterus and fallopian tubes to be surgically absent.  The right ovary was freely mobile in did not have adhesions.  There were epiploic adhesions between the left colon and the left pelvic brim extending down into the left pelvis.  These adhesions had to be lysed and the colon mobilized to allow visualization of the left ovary.  The left ovary was grossly normal in appearance.  In the posterior cul-de-sac, on the right, there was a small peritoneal window with an implant of endometriosis at the base.  This area of endometriosis was cauterized using the LigaSure.  The right ovary was removed and sent to pathology.  The remainder of the pelvis and abdomen appeared normal.   TECHNIQUE:  The patient was taken to the operating room where general anesthesia was obtained without difficulty.  The patient was prepped and draped in the usual sterile fashion in the dorsal lithotomy position.  A weighted speculum was placed in the patient's vagina and a Hulka manipulator was advanced into the uterus and attached to the cervix.  Attention was then turned to the abdomen.  The skin of the umbilicus, suprapubic region, and left lower quadrant were infiltrated with 0.25% Marcaine.  A  small skin incision was then made at the base of the umbilicus.  A Veress needle was inserted in the abdomen and intra-abdominal placement was confirmed by saline drop test.  Pneumoperitoneum was then obtained with approximately 3 liters of CO2 gas.  A 5 mm trocar and sleeve was advanced in the abdomen and intra-abdominal placement was confirmed by laparoscope.  A second skin incision was made approximately 2 cm above the symphysis pubis in the midline and an 11 mm trocar and sleeve was advanced under direct visualization.  A third skin incision was made in the patient's left lower quadrant and a 5 mm trocar and sleeve was advanced under direct visualization.  Survey of the patient's pelvis and abdomen revealed above findings.  At this point, as stated, the patient's uterus and fallopian tubes were surgically absent.  There were epiploic adhesions between the left pelvic brim and left lower pelvis.  These adhesions had to be lysed and the left colon mobilized to allow visualization of the left ovary.  The ovary was normal in appearance.  The right ovary appeared normal and was freely mobile without adhesive disease.  Using the LigaSure, the right infundibulopelvic ligament was sealed and divided.  The right ovary was then placed in an endobag and pulled up through the suprapubic port site.  Next, the small peritoneal window in the posterior cul-de-sac on the right was cauterized using the LigaSure.  Cautery was well below the ureter.  The ureter was clearly  visualized and had normal caliber and peristalsis at the conclusion of the case.  Next, the pelvis was irrigated and excellent hemostasis maintained.  At this point, all laparoscopic instruments were removed from the abdomen.  Pneumoperitoneum was expressed.  The skin incisions were then closed in a subcuticular fashion using 4-0 Vicryl.  Steri-Strips were applied and bandages placed.  The patient tolerated the procedure well.  Sponge, lap and instrument counts  were all correct.  The patient was taken to the recovery room in stable condition.      Ashley Royalty, MD, Evern Core

## 2023-07-08 NOTE — Anesthesia Procedure Notes (Signed)
Stacy Cantrell    Airway Note  General Information and Staff   Authorizing provider: Cornett, Mayme Genta, MD  Performing provider: Bobette Mo, CRNA        Urgency: elective    Airway not difficult    Indications and Patient Condition  Pt location: In Or  Indications for airway management: anesthesia  Spontaneous ventilation: present  Sedation level: deep  Preoxygenated: yes  Patient position: sniffing  Mask difficulty assessment: 2 - vent by mask + OA or adjuvant +/- NMBA        Final Airway Details  Final airway type: endotracheal airway        Successful airway: ETT     Successful intubation technique: direct laryngoscopy              Blade: Macintosh  Blade size: #3  Airway size (mm): 7.5  Cormack-Lehane Classification: grade I - full view of glottis  Placement verified by: chest auscultation and capnometry   Marked at 21  Measured from: lips  Secured with: Tape  Number of attempts at approach: 1Airway complications: Atraumatic  Additional Comments  Teeth as preop

## 2023-07-08 NOTE — H&P (Signed)
Written H&P is signed and on the chart

## 2023-07-08 NOTE — H&P (Signed)
[facility]      H&P UPDATE FORM                                                                                  Stacy, Cantrell, 43 y.o. female  Date of Admission:  07/08/2023  Date of Birth:  1980/07/10    07/08/2023    STOP: IF H&P IS GREATER THAN 30 DAYS FROM SURGICAL DAY COMPLETE NEW H&P IS REQUIRED.     H & P updated the day of the procedure.  1.  H&P completed within 30 days of surgical procedure and has been reviewed within 24 hours of admission but prior to surgery or a procedure requiring anesthesia services, the patient has been examined, and no change has occured in the patients condition since the H&P was completed.       Change in medications: No        No LMP recorded. Patient has had a hysterectomy.      Comments:     2.  Patient continues to be appropriate candidate for planned surgical procedure. YES    Ashley Royalty, MD, Evern Core

## 2023-07-08 NOTE — Anesthesia Postprocedure Evaluation (Signed)
Anesthesia Post Op Evaluation    Patient: Stacy Cantrell  Procedure(s):  LAPAROSCOPIC RIGHT OOPHORECTOMY; LYSIS OF ADHESIONS, COAGULATION OF ENDOMETRIOSIS    Last Vitals:Temperature: 36.4 C (97.5 F) (07/08/23 1030)  Heart Rate: 85 (07/08/23 1030)  BP (Non-Invasive): 117/74 (07/08/23 1030)  Respiratory Rate: 19 (07/08/23 1030)  SpO2: 99 % (07/08/23 1030)    No notable events documented.    Patient is sufficiently recovered from the effects of anesthesia to participate in the evaluation and has returned to their pre-procedure level.  Patient location during evaluation: PACU       Patient participation: complete - patient participated  Level of consciousness: awake and alert and responsive to verbal stimuli    Pain management: adequate  Airway patency: patent    Anesthetic complications: no  Cardiovascular status: acceptable  Respiratory status: acceptable  Hydration status: acceptable  Patient post-procedure temperature: Pt Normothermic   PONV Status: Absent

## 2023-07-08 NOTE — Anesthesia Transfer of Care (Signed)
ANESTHESIA TRANSFER OF CARE   Stacy Cantrell is a 43 y.o. ,female, Weight: 92.1 kg (203 lb)   had Procedure(s):  LAPAROSCOPIC RIGHT OOPHORECTOMY; LYSIS OF ADHESIONS, COAGULATION OF ENDOMETRIOSIS  performed  07/08/23   Primary Service: Beaulah Dinning Edwar*    Past Medical History:   Diagnosis Date   . 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 Allergic rhinitis    . Anxiety    . COVID-19 vaccine series declined    . Depression    . Gastroenteritis    . Headache    . Insomnia disorder       Allergy History as of 07/08/23       CODEINE         Noted Status Severity Type Reaction    11/29/21 0854 Gildardo Griffes, MA 09/26/20 Active High  Hives/ Urticaria    08/15/21 1555 Day, [place], [place] 08/15/21 Active                 DIPHENHYDRAMINE         Noted Status Severity Type Reaction    07/08/23 0545 Thea Alken, RN 08/15/21 Active Low  Itching    08/15/21 1556 Day, Kayla, MA 08/15/21 Active Low  Hives/ Urticaria              PENICILLINS         Noted Status Severity Type Reaction    11/29/21 0854 Gildardo Griffes, MA 09/26/20 Active High  Hives/ Urticaria    08/15/21 1556 Day, [place], [place] 08/15/21 Active                 DIPHENHYDRAMINE HCL         Noted Status Severity Type Reaction    11/29/21 0854 Gildardo Griffes, MA 09/26/20 Active High  Hives/ Urticaria                  I completed my transfer of care / handoff to the receiving personnel during which we discussed:  Access, Airway, All key/critical aspects of case discussed, Analgesia, Antibiotics, Expectation of post procedure, Fluids/Product, Gave opportunity for questions and acknowledgement of understanding, Labs and PMHx      Post Location: PACU                                                           Last OR Temp: Temperature: 36.6 C (97.9 F)  ABG:  POTASSIUM   Date Value Ref Range Status   03/19/2023 4.2 3.5 - 5.1 mmol/L Final     CALCIUM   Date Value Ref Range Status   03/19/2023 9.7 8.6 - 10.3 mg/dL Final     Airway:* No LDAs found  *  Blood pressure 112/79, pulse 86, temperature 36.6 C (97.9 F), resp. rate 16, height 1.6 m (5\' 3" ), weight 92.1 kg (203 lb), SpO2 98%.

## 2023-07-08 NOTE — Discharge Instructions (Signed)
Call the office for a 2 week follow up appointment.

## 2023-07-08 NOTE — Anesthesia Preprocedure Evaluation (Signed)
ANESTHESIA PRE-OP EVALUATION  Planned Procedure: LAPAROSCOPIC RIGHT SALPINGO OOPHORECTOMY (Right: Vagina )  Review of Systems     anesthesia history negative     patient summary reviewed  nursing notes reviewed        Pulmonary  negative pulmonary ROS,    Cardiovascular    Hypertension, well controlled and ECG reviewed ,No peripheral edema,  Exercise Tolerance: > or = 4 METS        GI/Hepatic/Renal   negative GI/hepatic/renal ROS,         Endo/Other    obesity,      Neuro/Psych/MS    headaches (migraines), anxiety, depression     Cancer    negative hematology/oncology ROS,                     Physical Assessment      Airway       Mallampati: III    TM distance: >3 FB    Neck ROM: full  Mouth Opening: good.            Dental           (+) missing           Pulmonary    Breath sounds clear to auscultation  (-) no rhonchi, no decreased breath sounds, no wheezes, no rales and no stridor     Cardiovascular    Rhythm: regular  Rate: Normal  (-) no friction rub, carotid bruit is not present, no peripheral edema and no murmur     Other findings              Plan  ASA 2     Planned anesthesia type: general     general anesthesia with endotracheal tube intubation      PONV Plan:  I plan to administer pharmcologic prophalaxis antiemetics  POV PLAN:   plan for postoperative opioid use, post-pubertal            Intravenous induction     Anesthesia issues/risks discussed are: Dental Injuries, Eye /Visual Loss, PONV, Nerve Injuries, Stroke, Aspiration, Difficult Airway, Cardiac Events/MI, Intraoperative Awareness/ Recall, Blood Loss and Sore Throat.  Anesthetic plan and risks discussed with patient  signed consent obtained      Use of blood products discussed with patient who consented to blood products.      Patient's NPO status is appropriate for Anesthesia.           Plan discussed with CRNA.    (Use glidescope)

## 2023-07-09 ENCOUNTER — Other Ambulatory Visit (INDEPENDENT_AMBULATORY_CARE_PROVIDER_SITE_OTHER): Payer: Self-pay | Admitting: Physician Assistant

## 2023-07-09 DIAGNOSIS — R1031 Right lower quadrant pain: Secondary | ICD-10-CM

## 2023-07-09 DIAGNOSIS — N80101 Endometriosis of right ovary, unspecified depth: Secondary | ICD-10-CM

## 2023-07-09 LAB — SURGICAL PATHOLOGY SPECIMEN

## 2023-07-09 MED ORDER — ESCITALOPRAM 20 MG TABLET
20.0000 mg | ORAL_TABLET | Freq: Every day | ORAL | 1 refills | Status: DC
Start: 2023-07-09 — End: 2023-11-11

## 2023-07-21 ENCOUNTER — Ambulatory Visit: Payer: Commercial Managed Care - PPO | Attending: Physician Assistant | Admitting: Physician Assistant

## 2023-07-21 ENCOUNTER — Other Ambulatory Visit: Payer: Self-pay

## 2023-07-21 ENCOUNTER — Encounter (INDEPENDENT_AMBULATORY_CARE_PROVIDER_SITE_OTHER): Payer: Self-pay | Admitting: Physician Assistant

## 2023-07-21 VITALS — BP 108/72 | HR 87 | Temp 98.2°F | Ht 63.0 in | Wt 198.0 lb

## 2023-07-21 DIAGNOSIS — U071 COVID-19: Secondary | ICD-10-CM | POA: Insufficient documentation

## 2023-07-21 DIAGNOSIS — J329 Chronic sinusitis, unspecified: Secondary | ICD-10-CM | POA: Insufficient documentation

## 2023-07-21 DIAGNOSIS — R52 Pain, unspecified: Secondary | ICD-10-CM | POA: Insufficient documentation

## 2023-07-21 DIAGNOSIS — R058 Other specified cough: Secondary | ICD-10-CM | POA: Insufficient documentation

## 2023-07-21 DIAGNOSIS — R509 Fever, unspecified: Secondary | ICD-10-CM | POA: Insufficient documentation

## 2023-07-21 LAB — POCT RAPID COVID (SOFIA) (AMB ONLY): COVID-19 AG: POSITIVE — AB

## 2023-07-21 MED ORDER — PROMETHAZINE-DM 6.25 MG-15 MG/5 ML ORAL SYRUP
5.0000 mL | ORAL_SOLUTION | Freq: Four times a day (QID) | ORAL | 0 refills | Status: AC | PRN
Start: 2023-07-21 — End: 2023-07-28

## 2023-07-21 MED ORDER — DOXYCYCLINE HYCLATE 100 MG TABLET
100.0000 mg | ORAL_TABLET | Freq: Two times a day (BID) | ORAL | 0 refills | Status: AC
Start: 2023-07-21 — End: 2023-07-31

## 2023-07-21 MED ORDER — TRIAMCINOLONE ACETONIDE 40 MG/ML SUSPENSION FOR INJECTION
40.0000 mg | INTRAMUSCULAR | Status: AC
Start: 2023-07-21 — End: 2023-07-21
  Administered 2023-07-21: 40 mg via INTRAMUSCULAR

## 2023-07-21 MED ORDER — PREDNISONE 20 MG TABLET
20.0000 mg | ORAL_TABLET | Freq: Two times a day (BID) | ORAL | 0 refills | Status: AC
Start: 2023-07-21 — End: 2023-07-26

## 2023-07-21 NOTE — Nursing Note (Signed)
Patient is here for sick visit. Sick since Saturday, fever off and on, chills, congestion, cough with green sputum, H/A

## 2023-07-21 NOTE — Nursing Note (Signed)
07/21/23 1400   Rapid Flu   Time Performed 1431   Rapid Flu A Result Negative   Rapid Flu B Result Negative   POCT Instrument Sofia   Initials KD,CMA   Rapid Strep   Time Performed 1431   Rapid Strep Negative   Ordering Physician [facility]   POCT Instrument Sofia   Nurse initials KD,CMA

## 2023-07-21 NOTE — Progress Notes (Signed)
INTERNAL MEDICINE, BUILDING A  [address]  [place] [address]  Operated by [facility]  Progress Note    Name: Stacy Cantrell MRN:  [id number]   Date: 07/21/2023 DOB:  1980/10/27 (43 y.o.)              Chief Complaint: Feel Sick (Sick since Saturday, fever off and on, chills, congestion, cough with green sputum, H/A)       HPI: Stacy Cantrell is a 43 y.o. female who complains of  cough cong headache body aches sweats chills  sore throat since sat. Has been spitting/cough up thick green sputum and states pressure head is "terrible" causing ears and teeth to hurt. Has been using alka seltzer OTC as well as took a few zithromax she had left over but sx no better. +fever low grade off and on also noted.      Allergies:  Allergies   Allergen Reactions    Codeine Hives/ Urticaria    Diphenhydramine Hcl Hives/ Urticaria    Penicillins Hives/ Urticaria    Diphenhydramine Itching       Current Medications:  amLODIPine (NORVASC) 5 mg Oral Tablet, Take 1 Tablet (5 mg total) by mouth Once a day  ARIPiprazole (ABILIFY) 2 mg Oral Tablet, Take 1 Tablet (2 mg total) by mouth Once a day  cetirizine (ZYRTEC) 10 mg Oral Tablet, TAKE 1 TABLET BY MOUTH EVERY DAY AS NEEDED  cyanocobalamin (VITAMIN B12) 1,000 mcg/mL Injection Solution, Inject 1 mL (1,000 mcg total) under the skin Every 30 days  escitalopram oxalate (LEXAPRO) 20 mg Oral Tablet, Take 1 Tablet (20 mg total) by mouth Once a day  Ibuprofen (MOTRIN) 800 mg Oral Tablet, Take 1 Tablet (800 mg total) by mouth Three times a day as needed for Pain  meloxicam (MOBIC) 15 mg Oral Tablet, TAKE 1 TABLET BY MOUTH EVERY DAY AS NEEDED FOR PAIN WITH FOOD  multivitamin-iron-folic acid (CENTRUM) 18-400 mg-mcg Oral Tablet, Take 1 Tablet by mouth Once a day (Patient not taking: Reported on 07/08/2023)  ondansetron (ZOFRAN ODT) 8 mg Oral Tablet, Rapid Dissolve, Place 1 Tablet (8 mg total) under the tongue Every 8 hours as needed for  Nausea/Vomiting  oxyCODONE-acetaminophen (PERCOCET) 7.5-325 mg Oral Tablet, Take 1 Tablet by mouth Every 6 hours as needed for Pain  temazepam (RESTORIL) 7.5 mg Oral Capsule, Take 1 Capsule (7.5 mg total) by mouth Every night as needed for Insomnia  topiramate (TOPAMAX) 50 mg Oral Tablet, Take 1 Tablet (50 mg total) by mouth Once a day    No facility-administered medications prior to visit.       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     Allergic rhinitis     Anxiety     COVID-19 vaccine series declined     Depression     Gastroenteritis     Headache     Insomnia disorder            Social History     Tobacco Use    Smoking status: Former     Current packs/day: 0.00     Average packs/day: 0.5 packs/day for 15.7 years (7.9 ttl pk-yrs)     Types: Cigarettes     Start date: 10/06/1998     Quit date: 06/30/2014     Years since quitting: 9.0    Smokeless tobacco: Never   Vaping Use    Vaping status: Never Used  Substance Use Topics    Alcohol use: Yes     Alcohol/week: 4.0 standard drinks of alcohol     Types: 4 Glasses of wine per week    Drug use: Never       OBJECTIVE:  Vitals:    07/21/23 1354   BP: 108/72   Pulse: 87   Temp: 36.8 C (98.2 F)   SpO2: 96%   Weight: 89.8 kg (198 lb)   Height: 1.6 m (5\' 3" )   BMI: 35.07      Physical Exam  Vitals and nursing note reviewed.   Constitutional:       Appearance: Normal appearance.   HENT:      Ears:      Comments: Tm's dull bilat  +sinus cavity tenderness on palp frontal area as well as pressure noted of frontal and maxillary cavities w forward flexion of head     Mouth/Throat:      Mouth: Mucous membranes are moist.      Pharynx: Posterior oropharyngeal erythema present.   Eyes:      Pupils: Pupils are equal, round, and reactive to light.   Cardiovascular:      Rate and Rhythm: Normal rate and regular rhythm.      Pulses: Normal pulses.   Pulmonary:      Effort: Pulmonary effort is normal.      Breath sounds:  Rhonchi present. No wheezing.   Abdominal:      Palpations: Abdomen is soft.      Tenderness: There is no abdominal tenderness.   Musculoskeletal:      Cervical back: Normal range of motion and neck supple.   Lymphadenopathy:      Cervical: No cervical adenopathy.   Skin:     General: Skin is warm.      Findings: No rash.   Neurological:      Mental Status: She is alert and oriented to person, place, and time.   Psychiatric:         Mood and Affect: Mood normal.         Behavior: Behavior normal.         Thought Content: Thought content normal.         Judgment: Judgment normal.          Rapid Flu Results:    negative     Rapid Strep Results:    negative     COVID Results   positive          Assessment & Plan  COVID  Pt sx started almost 5 days ago so opted to treat sx and avoid paxlovid  To increase vit c zinc rest fluids  Sinusitis, unspecified chronicity, unspecified location  Doxy 100 mg bid x 10 days  Pred 20 mg bid x 5 days to start on thurs  Respiratory tract congestion with cough  Kenalog injection given  Prometh dm prn cough  Body aches  Rest fluids  Fever, unspecified fever cause  Continue alternating tylenol/motrin       PLAN: Treatment per orders . Call or return to clinic prn if these symptoms worsen or fail to improve as anticipated.  Orders Placed This Encounter    triamcinolone acetonide (KENALOG-40) 40 mg/mL injection    predniSONE (DELTASONE) 20 mg Oral Tablet    promethazine-dextromethorphan (PHENERGAN-DM) 6.25-15 mg/5 mL Oral Syrup    doxycycline 100 mg Oral Tablet      Post-Discharge Follow Up Appointments       Tuesday  Sep 01, 2023    Return Patient Visit with Eyvonne Mechanic, PA-C at  2:00 PM      Friday Dec 18, 2023    Return Patient Visit with Eyvonne Mechanic, PA-C at  8:30 AM      Internal Medicine, Building A  [facility]  [address]  [place] [address]  [phone]             This note was partially created using MModal Fluency Direct system (voice recognition software) and  is inherently subject to errors including those of syntax and "sound-alike" substitutions which may escape proofreading.  In such instances, original meaning may be extrapolated by contextual derivation.    Shermaine Rivet, PA-C

## 2023-07-23 IMAGING — MR MRI KNEE RT W/O CONTRAST
5 series · 37 of 40 positions shown · IV contrast (gadolinium)
Comparison: None previous available.

﻿EXAM:  [id number]   MRI KNEE RT W/O CONTRAST
INDICATION: 42-year-old with persistent knee pain. Diminished range of motion.  No prior history of knee surgery.
TECHNIQUE: Multiplanar, multisequential MRI of the right knee was performed without gadolinium contrast.

[Series 5: PD fat-sat · axial · right · 4.0mm · 0.37mm/px · z∈[-26,+87]mm · 7 of 26 slices shown (1 of 3)]
[im 1/26]
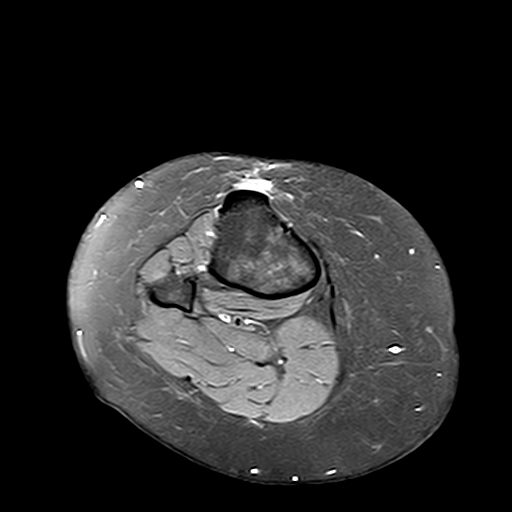
[im 5/26]
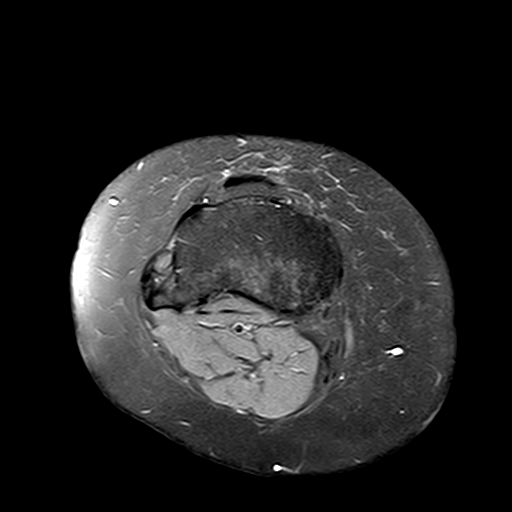
[im 9/26]
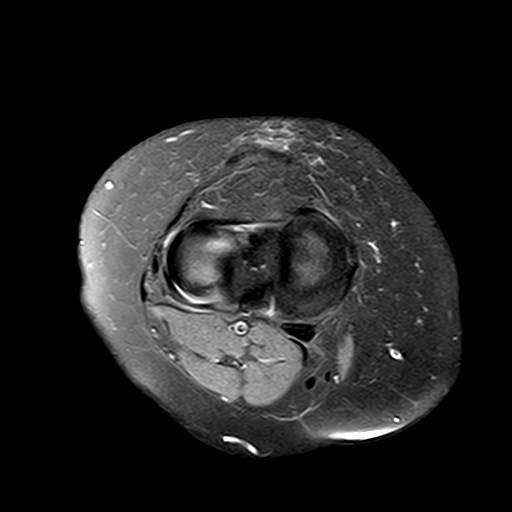
[im 13/26]
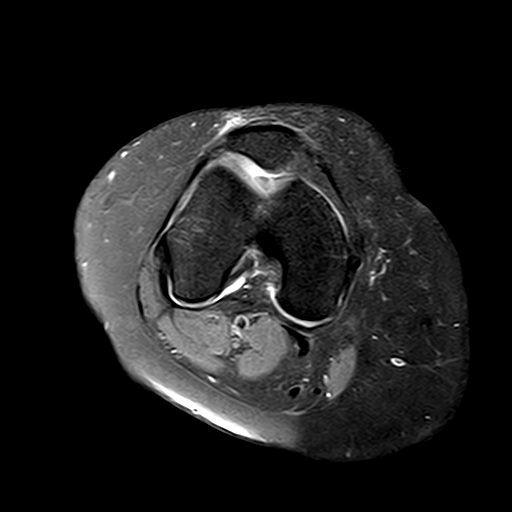
[im 17/26]
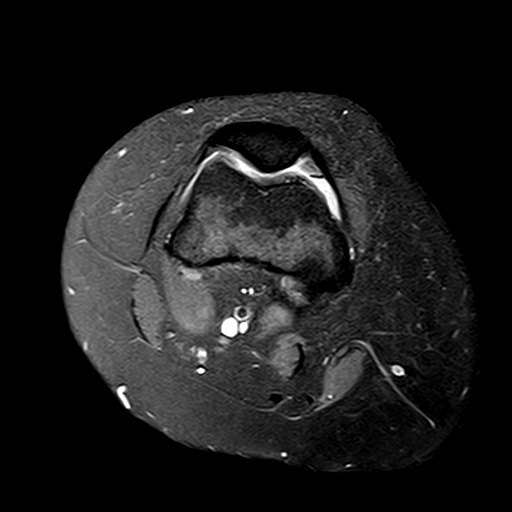
[im 21/26]
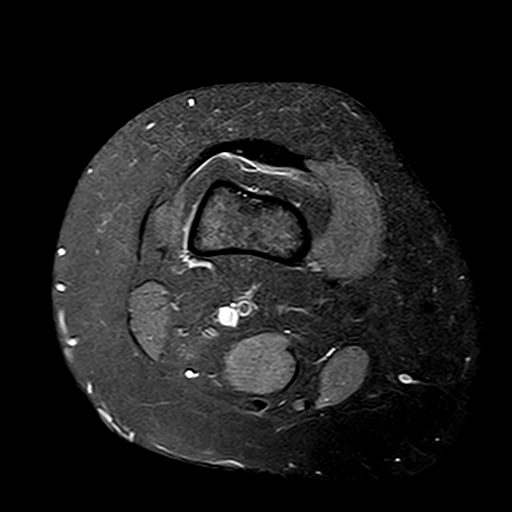
[im 26/26]
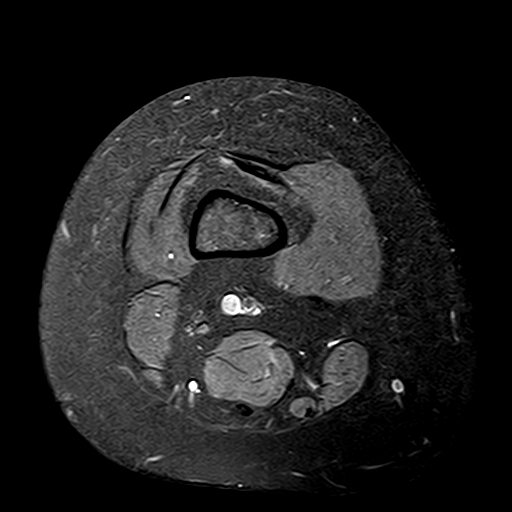

[Series 6: PD fat-sat · sagittal · right · 3.5mm · 0.56mm/px · 7 of 26 slices shown (2 of 3)]
[im 1/26]
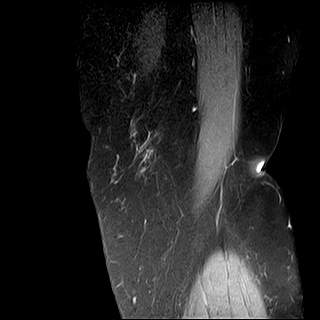
[im 5/26]
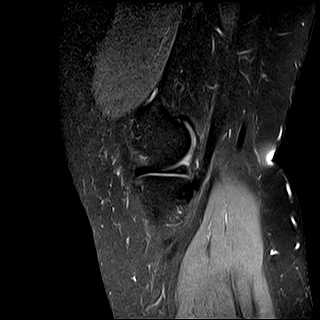
[im 9/26]
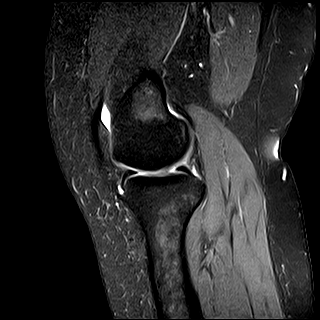
[im 13/26]
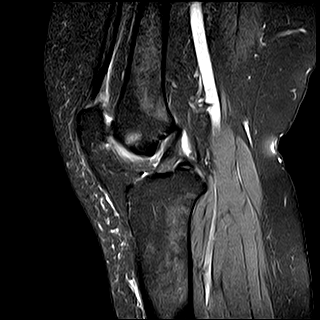
[im 17/26]
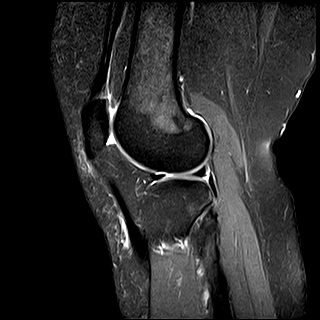
[im 21/26]
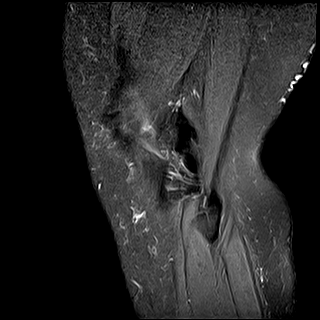
[im 26/26]
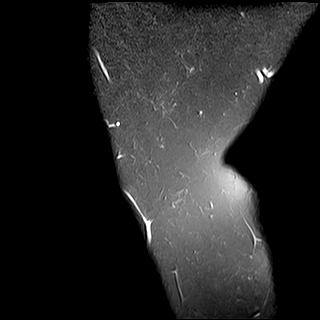

[Series 7: T1 · sagittal · right · 3.5mm · 0.56mm/px · 8 of 26 slices shown]
[im 1/26]
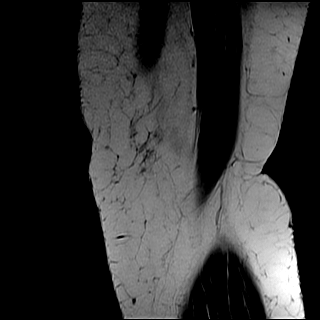
[im 4/26]
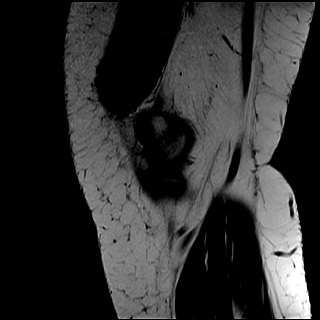
[im 8/26]
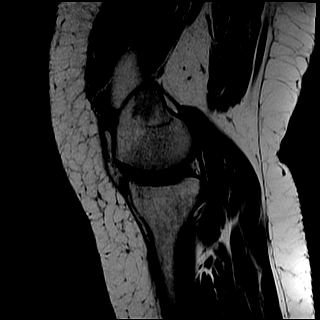
[im 11/26]
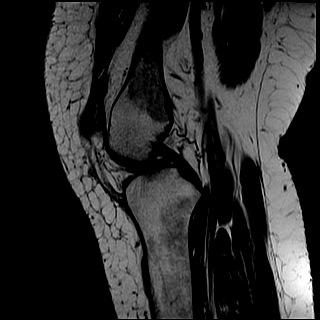
[im 15/26]
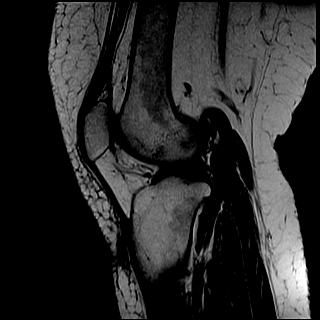
[im 18/26]
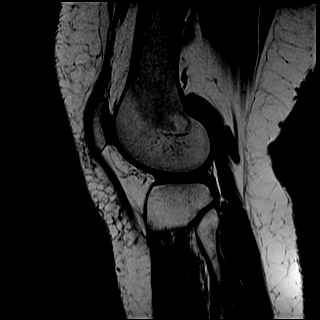
[im 22/26]
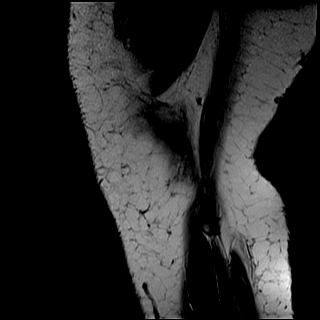
[im 26/26]
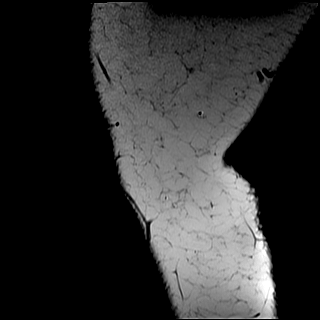

[Series 8: STIR · coronal · right · 3.5mm · 0.59mm/px · 6 of 30 slices shown]
[im 1/30]
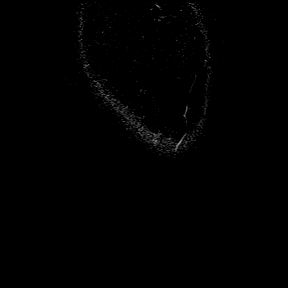
[im 4/30]
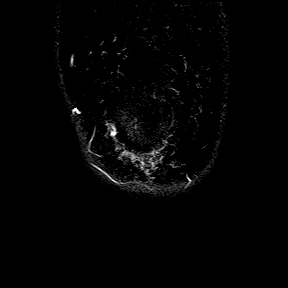
[im 8/30]
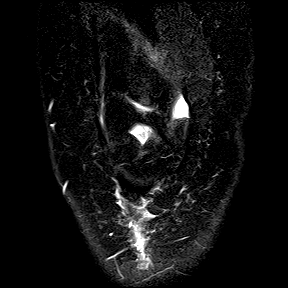
[im 11/30]
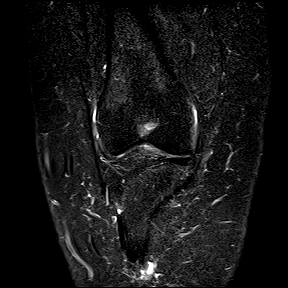
[im 19/30]
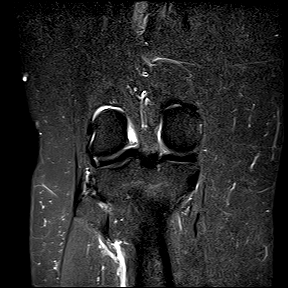
[im 22/30]
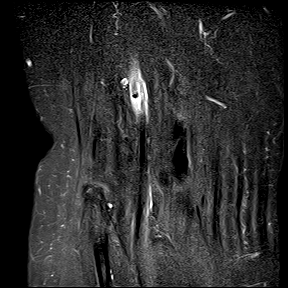

[Series 9: PD fat-sat · coronal · right · 3.5mm · 0.53mm/px · 9 of 30 slices shown (3 of 3)]
[im 1/30]
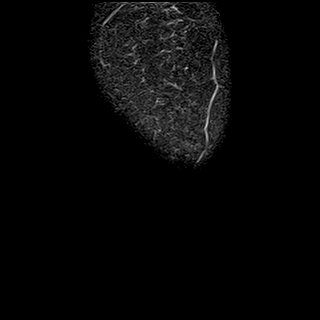
[im 4/30]
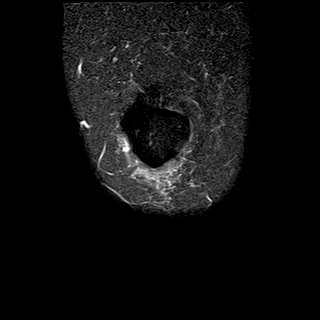
[im 8/30]
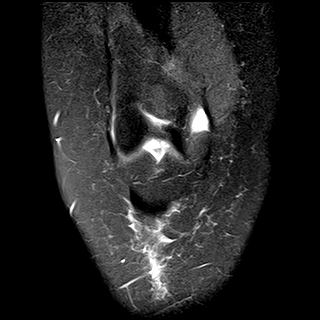
[im 11/30]
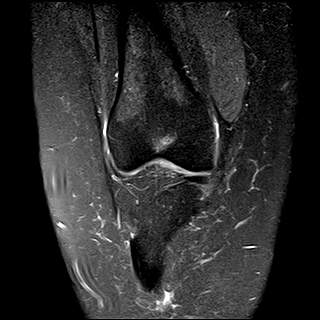
[im 15/30]
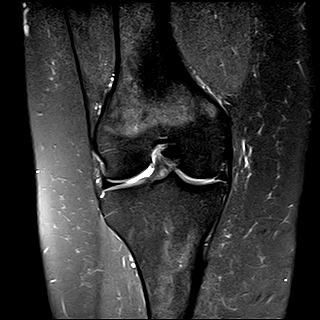
[im 19/30]
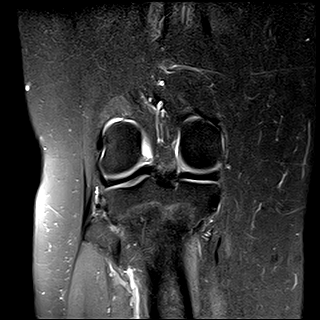
[im 22/30]
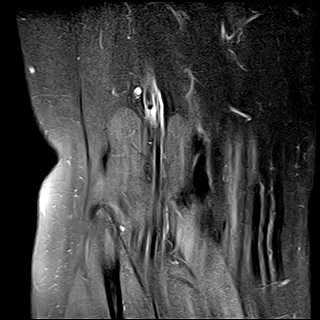
[im 26/30]
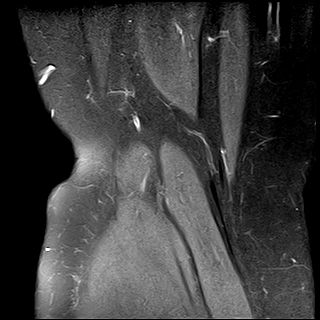
[im 30/30]
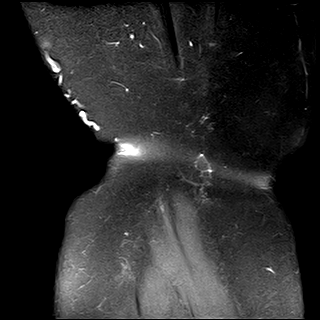

[37 of 40 positions shown; findings below may reference images not displayed]

FINDINGS: No acute lesions at the right knee. Lateral meniscus and lateral articular cartilage are normal.  The anterior and posterior cruciate ligaments are intact.

Medial meniscus shows no acute findings.  Grade 2 degenerative changes of medial articular cartilage are noted. Collateral ligaments are intact. 

Quadriceps tendon and patellar tendon are intact.  Infrapatellar, prepatellar bursitis is noted.  Mild patellar tendinitis is noted.  Soft tissues of the popliteal fossa are normal.
IMPRESSION: 1. No acute bony lesions.  No internal derangement of menisci, cruciate ligaments and collateral ligaments.

2. Grade 2 degenerative changes of medial articular cartilage over the tibial plateau.

3. Prepatellar, infrapatellar bursitis.  Mild patellar tendinitis.

## 2023-08-03 ENCOUNTER — Other Ambulatory Visit (INDEPENDENT_AMBULATORY_CARE_PROVIDER_SITE_OTHER): Payer: Self-pay | Admitting: Physician Assistant

## 2023-08-04 MED ORDER — TEMAZEPAM 7.5 MG CAPSULE
7.5000 mg | ORAL_CAPSULE | Freq: Every evening | ORAL | 0 refills | Status: DC | PRN
Start: 2023-08-04 — End: 2023-09-10

## 2023-09-01 ENCOUNTER — Encounter (INDEPENDENT_AMBULATORY_CARE_PROVIDER_SITE_OTHER): Payer: Self-pay | Admitting: Physician Assistant

## 2023-09-01 ENCOUNTER — Other Ambulatory Visit: Payer: Self-pay

## 2023-09-01 ENCOUNTER — Ambulatory Visit: Payer: Commercial Managed Care - PPO | Attending: Physician Assistant | Admitting: Physician Assistant

## 2023-09-01 ENCOUNTER — Other Ambulatory Visit (INDEPENDENT_AMBULATORY_CARE_PROVIDER_SITE_OTHER): Payer: Self-pay | Admitting: Physician Assistant

## 2023-09-01 VITALS — BP 122/84 | HR 88 | Ht 63.0 in | Wt 208.0 lb

## 2023-09-01 DIAGNOSIS — Z1231 Encounter for screening mammogram for malignant neoplasm of breast: Secondary | ICD-10-CM | POA: Insufficient documentation

## 2023-09-01 DIAGNOSIS — F419 Anxiety disorder, unspecified: Secondary | ICD-10-CM | POA: Insufficient documentation

## 2023-09-01 DIAGNOSIS — Z6841 Body Mass Index (BMI) 40.0 and over, adult: Secondary | ICD-10-CM

## 2023-09-01 DIAGNOSIS — E7800 Pure hypercholesterolemia, unspecified: Secondary | ICD-10-CM | POA: Insufficient documentation

## 2023-09-01 DIAGNOSIS — E669 Obesity, unspecified: Secondary | ICD-10-CM | POA: Insufficient documentation

## 2023-09-01 DIAGNOSIS — F32A Depression, unspecified: Secondary | ICD-10-CM | POA: Insufficient documentation

## 2023-09-01 DIAGNOSIS — E559 Vitamin D deficiency, unspecified: Secondary | ICD-10-CM | POA: Insufficient documentation

## 2023-09-01 DIAGNOSIS — G4700 Insomnia, unspecified: Secondary | ICD-10-CM | POA: Insufficient documentation

## 2023-09-01 DIAGNOSIS — R5382 Chronic fatigue, unspecified: Secondary | ICD-10-CM | POA: Insufficient documentation

## 2023-09-01 DIAGNOSIS — Z20828 Contact with and (suspected) exposure to other viral communicable diseases: Secondary | ICD-10-CM | POA: Insufficient documentation

## 2023-09-01 DIAGNOSIS — I10 Essential (primary) hypertension: Secondary | ICD-10-CM | POA: Insufficient documentation

## 2023-09-01 MED ORDER — OSELTAMIVIR 75 MG CAPSULE
75.0000 mg | ORAL_CAPSULE | Freq: Every day | ORAL | 0 refills | Status: AC
Start: 2023-09-01 — End: 2023-09-11

## 2023-09-01 NOTE — Progress Notes (Signed)
 INTERNAL MEDICINE, BUILDING A  [address]  [place] [address]  Operated by [facility]  Progress Note    Name: Stacy Cantrell MRN:  [id number]   Date: 09/01/2023 DOB:  [birth year] (43 y.o.)              Chief Complaint: Follow Up 3 Months (Routine )   Flu exposure    HPI: Stacy Cantrell is a 43 y.o. female who presents to the office today in f/u concerning  blood pressure/hypertension for which she is currently taking Norvasc 5 mg daily.  Blood pressure today is stable at 122/84.  She also has  Raynaud's which the Norvasc has helped.  Currently at this time she denies any chest pain headaches dizziness.   She is also being followed by a cardiologist.    F/u elevated chol w pt currently not on chol med. Continues to try and watch diet. F/u labs needed.    Follow-up chronic anxiety/depression with patient currently taking Lexapro  20 mg daily and Abilify  2mg  daily.  She denies panic attacks or new concerns but once again has a lot going on so anxiety may be a little worse lately.  She also suffers from insomnia for which she is using restoril  7.5mg  q hs which does help.     She does note hx of chronic fatigue and reports little increase in her fatigue w known flu exposure. Request RX for proph Tamiflu. Dneies any fever chills cough sore throat or ill sx. Pt has not had a flu vac and refuses.    She also has hx of migraines for which she has been seen by neurologist in the past. Headaches stable at this time on current meds.    Mammo order needed    Allergies:  Allergies   Allergen Reactions    Codeine Hives/ Urticaria    Diphenhydramine Hcl Hives/ Urticaria    Penicillins Hives/ Urticaria    Diphenhydramine Itching       Current Medications:  cetirizine  (ZYRTEC ) 10 mg Oral Tablet, TAKE 1 TABLET BY MOUTH EVERY DAY AS NEEDED  escitalopram  oxalate (LEXAPRO ) 20 mg Oral Tablet, Take 1 Tablet (20 mg total) by mouth Once a day  Ibuprofen  (MOTRIN ) 800 mg Oral Tablet, Take 1 Tablet (800 mg  total) by mouth Three times a day as needed for Pain  meloxicam (MOBIC) 15 mg Oral Tablet, TAKE 1 TABLET BY MOUTH EVERY DAY AS NEEDED FOR PAIN WITH FOOD  multivitamin-iron-folic acid  (CENTRUM) 18-400 mg-mcg Oral Tablet, Take 1 Tablet by mouth Once a day (Patient not taking: Reported on 07/08/2023)  ondansetron  (ZOFRAN  ODT) 8 mg Oral Tablet, Rapid Dissolve, Place 1 Tablet (8 mg total) under the tongue Every 8 hours as needed for Nausea/Vomiting  oxyCODONE -acetaminophen  (PERCOCET) 7.5-325 mg Oral Tablet, Take 1 Tablet by mouth Every 6 hours as needed for Pain  topiramate  (TOPAMAX ) 50 mg Oral Tablet, Take 1 Tablet (50 mg total) by mouth Once a day  amLODIPine  (NORVASC) 5 mg Oral Tablet, Take 1 Tablet (5 mg total) by mouth Once a day  ARIPiprazole  (ABILIFY ) 2 mg Oral Tablet, Take 1 Tablet (2 mg total) by mouth Once a day  cyanocobalamin  (VITAMIN B12) 1,000 mcg/mL Injection Solution, Inject 1 mL (1,000 mcg total) under the skin Every 30 days  temazepam  (RESTORIL ) 7.5 mg Oral Capsule, Take 1 Capsule (7.5 mg total) by mouth Every night as needed for Insomnia    No facility-administered medications prior to visit.  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     Allergic rhinitis     Anxiety     COVID-19 vaccine series declined     Depression     Gastroenteritis     Headache     Insomnia disorder            Social History     Tobacco Use    Smoking status: Former     Current packs/day: 0.00     Average packs/day: 0.5 packs/day for 15.7 years (7.9 ttl pk-yrs)     Types: Cigarettes     Start date: 10/06/1998     Quit date: 06/30/2014     Years since quitting: 9.3    Smokeless tobacco: Never   Vaping Use    Vaping status: Never Used   Substance Use Topics    Alcohol use: Yes     Alcohol/week: 4.0 standard drinks of alcohol     Types: 4 Glasses of wine per week    Drug use: Never       OBJECTIVE:  Vitals:    09/01/23 1430   BP: 122/84   Pulse: 88   SpO2: 97%   Weight:  94.3 kg (208 lb)   Height: 1.6 m (5\' 3" )   BMI: 36.85      Physical Exam  Vitals and nursing note reviewed.   Constitutional:       Appearance: Normal appearance.    HENT:      Right Ear: Tympanic membrane normal.      Left Ear: Tympanic membrane normal.      Mouth/Throat:      Mouth: Mucous membranes are moist.      Pharynx: Oropharynx is clear. No posterior oropharyngeal erythema.   Eyes:      Pupils: Pupils are equal, round, and reactive to light.   Cardiovascular:      Rate and Rhythm: Normal rate and regular rhythm.      Pulses: Normal pulses.   Pulmonary:      Effort: Pulmonary effort is normal.      Breath sounds: Normal breath sounds.   Abdominal:      General: Bowel sounds are normal. There is no distension.      Palpations: Abdomen is soft.      Tenderness: There is no abdominal tenderness.   Musculoskeletal:      Cervical back: Normal range of motion and neck supple.   Lymphadenopathy:      Cervical: No cervical adenopathy.   Skin:     General: Skin is warm.      Findings: No rash.   Neurological:      General: No focal deficit present.      Mental Status: She is alert and oriented to person, place, and time.   Psychiatric:         Mood and Affect: Mood normal.         Behavior: Behavior normal.         Thought Content: Thought content normal.         Judgment: Judgment normal.     Assessment & Plan  Hypertension, unspecified type  BP stable on current meds  Continue monitoring  Elevated cholesterol  Labs obtained in office today  Continue diet  Anxiety  Stable on lexapro   Depression, unspecified depression type  Stable on Abilify  w lexapro   Insomnia, unspecified type  Continue restoril  prn  Chronic  fatigue  Continue vit b12  Exposure to influenza  Tamiflu 75 mg daily x 10 days  Obesity (BMI 35.0-39.9 without comorbidity)  Continue diet wt management  Vitamin D  deficiency    Breast cancer screening by mammogram    Mammo ordered      PLAN: Treatment per orders . Call or return to clinic prn if these  symptoms worsen or fail to improve as anticipated.  Orders Placed This Encounter    MAMMO BILATERAL SCREENING-ADDL VIEWS/BREAST US  AS REQ BY RAD    VITAMIN B12    CBC/DIFF    COMPREHENSIVE METABOLIC PANEL, NON-FASTING    THYROID  STIMULATING HORMONE WITH FREE T4 REFLEX    LIPID PANEL    MAGNESIUM    CBC WITH DIFF    MANUAL DIFFERENTIAL    oseltamivir (TAMIFLU) 75 mg Oral Capsule    ARIPiprazole  (ABILIFY ) 2 mg Oral Tablet    cyanocobalamin  (VITAMIN B12) 1,000 mcg/mL Injection Solution      Post-Discharge Follow Up Appointments       Friday Dec 18, 2023    Return Patient Visit with Cloyce Darby, PA-C at  8:30 AM      Internal Medicine, Building A  [facility], [place]  [address]  [place] [address]  [phone]             This note was partially created using MModal Fluency Direct system (voice recognition software) and is inherently subject to errors including those of syntax and "sound-alike" substitutions which may escape proofreading.  In such instances, original meaning may be extrapolated by contextual derivation.    Corisa Montini, PA-C

## 2023-09-01 NOTE — Nursing Note (Signed)
Patient is here for routine 3 month follow up for medication refills.

## 2023-09-02 LAB — THYROID STIMULATING HORMONE WITH FREE T4 REFLEX: TSH: 2.156 u[IU]/mL (ref 0.450–5.330)

## 2023-09-02 LAB — CBC WITH DIFF
HCT: 43.3 % — ABNORMAL HIGH (ref 31.2–41.9)
HGB: 14.8 g/dL — ABNORMAL HIGH (ref 10.9–14.3)
MCH: 29 pg (ref 24.7–32.8)
MCHC: 34.1 g/dL (ref 32.3–35.6)
MCV: 85 fL (ref 75.5–95.3)
MPV: 7.7 fL — ABNORMAL LOW (ref 7.9–10.8)
PLATELETS: 207 10*3/uL (ref 140–440)
RBC: 5.09 10*6/uL — ABNORMAL HIGH (ref 3.63–4.92)
RDW: 13.2 % (ref 12.3–17.7)
WBC: 3.8 10*3/uL (ref 3.8–11.8)

## 2023-09-02 LAB — MANUAL DIFFERENTIAL
EOSINOPHIL %: 3 % (ref 0–7)
EOSINOPHIL ABSOLUTE: 0.12 10*3/uL (ref 0.00–0.80)
EOSINOPHILS MANUAL: 3
LYMPHOCYTE %: 34 % (ref 25–45)
LYMPHOCYTE ABSOLUTE: 1.31 10*3/uL (ref 1.10–5.00)
LYMPHOCYTES MANUAL: 34
MONOCYTE %: 18 % — ABNORMAL HIGH (ref 0–12)
MONOCYTE ABSOLUTE: 0.69 10*3/uL (ref 0.00–1.30)
MONOCYTES MANUAL: 18
NEUTROPHIL %: 44 % (ref 40–76)
NEUTROPHIL ABSOLUTE: 1.69 10*3/uL — ABNORMAL LOW (ref 1.80–8.40)
NEUTROPHILS MANUAL: 44
PLATELET MORPHOLOGY COMMENT: NORMAL
TOTAL CELLS COUNTED [#] IN BLOOD: 99
WBC: 3.8 10*3/uL

## 2023-09-02 LAB — COMPREHENSIVE METABOLIC PANEL, NON-FASTING
ALBUMIN/GLOBULIN RATIO: 1.4 (ref 0.8–1.4)
ALBUMIN: 4.3 g/dL (ref 3.5–5.7)
ALKALINE PHOSPHATASE: 55 U/L (ref 34–104)
ALT (SGPT): 33 U/L (ref 7–52)
ANION GAP: 6 mmol/L (ref 4–13)
AST (SGOT): 33 U/L (ref 13–39)
BILIRUBIN TOTAL: 0.3 mg/dL (ref 0.3–1.0)
BUN/CREA RATIO: 10 (ref 6–22)
BUN: 8 mg/dL (ref 7–25)
CALCIUM, CORRECTED: 8.7 mg/dL — ABNORMAL LOW (ref 8.9–10.8)
CALCIUM: 8.9 mg/dL (ref 8.6–10.3)
CHLORIDE: 105 mmol/L (ref 98–107)
CO2 TOTAL: 27 mmol/L (ref 21–31)
CREATININE: 0.82 mg/dL (ref 0.60–1.30)
ESTIMATED GFR: 92 mL/min/{1.73_m2} (ref >59)
GLOBULIN: 3 (ref 2.0–3.5)
GLUCOSE: 95 mg/dL (ref 74–109)
OSMOLALITY, CALCULATED: 274 mosm/kg (ref 270–290)
POTASSIUM: 4 mmol/L (ref 3.5–5.1)
PROTEIN TOTAL: 7.3 g/dL (ref 6.4–8.9)
SODIUM: 138 mmol/L (ref 136–145)

## 2023-09-02 LAB — VITAMIN B12: VITAMIN B 12: 482 pg/mL (ref 180–914)

## 2023-09-02 LAB — LIPID PANEL
CHOL/HDL RATIO: 2.9
CHOLESTEROL: 140 mg/dL (ref <200)
HDL CHOL: 49 mg/dL (ref >=40)
LDL CALC: 66 mg/dL (ref 0–100)
TRIGLYCERIDES: 125 mg/dL (ref <=150)
VLDL CALC: 25 mg/dL (ref 0–50)

## 2023-09-02 LAB — MAGNESIUM: MAGNESIUM: 2.1 mg/dL (ref 1.9–2.7)

## 2023-09-03 ENCOUNTER — Ambulatory Visit (INDEPENDENT_AMBULATORY_CARE_PROVIDER_SITE_OTHER): Payer: Self-pay

## 2023-09-03 MED ORDER — PROMETHAZINE-DM 6.25 MG-15 MG/5 ML ORAL SYRUP
5.0000 mL | ORAL_SOLUTION | Freq: Four times a day (QID) | ORAL | 0 refills | Status: AC | PRN
Start: 2023-09-03 — End: 2023-09-10

## 2023-09-04 ENCOUNTER — Other Ambulatory Visit (INDEPENDENT_AMBULATORY_CARE_PROVIDER_SITE_OTHER): Payer: Self-pay | Admitting: Physician Assistant

## 2023-09-10 ENCOUNTER — Other Ambulatory Visit (INDEPENDENT_AMBULATORY_CARE_PROVIDER_SITE_OTHER): Payer: Self-pay | Admitting: Physician Assistant

## 2023-09-10 MED ORDER — TEMAZEPAM 7.5 MG CAPSULE
7.5000 mg | ORAL_CAPSULE | Freq: Every evening | ORAL | 0 refills | Status: AC | PRN
Start: 2023-09-10 — End: ?

## 2023-09-10 NOTE — Telephone Encounter (Signed)
 Patient called stating that pharmacy did not receive refill of her medication.

## 2023-09-16 ENCOUNTER — Other Ambulatory Visit (INDEPENDENT_AMBULATORY_CARE_PROVIDER_SITE_OTHER): Payer: Self-pay

## 2023-09-16 DIAGNOSIS — Z1231 Encounter for screening mammogram for malignant neoplasm of breast: Secondary | ICD-10-CM

## 2023-10-17 ENCOUNTER — Encounter (INDEPENDENT_AMBULATORY_CARE_PROVIDER_SITE_OTHER): Payer: Self-pay | Admitting: Physician Assistant

## 2023-10-17 MED ORDER — ARIPIPRAZOLE 2 MG TABLET
2.0000 mg | ORAL_TABLET | Freq: Every day | ORAL | 1 refills | Status: DC
Start: 2023-10-17 — End: 2024-04-26

## 2023-10-17 MED ORDER — CYANOCOBALAMIN (VIT B-12) 1,000 MCG/ML INJECTION SOLUTION
1000.0000 ug | INTRAMUSCULAR | 3 refills | Status: DC
Start: 2023-10-17 — End: 2024-03-14

## 2023-10-17 NOTE — Assessment & Plan Note (Signed)
 Labs obtained in office today  Continue diet

## 2023-10-17 NOTE — Assessment & Plan Note (Signed)
Continue diet /wt management

## 2023-10-17 NOTE — Assessment & Plan Note (Signed)
 Continue vit b12

## 2023-10-17 NOTE — Assessment & Plan Note (Signed)
 BP stable on current meds  Continue monitoring

## 2023-10-17 NOTE — Assessment & Plan Note (Signed)
Continue restoril prn

## 2023-10-17 NOTE — Assessment & Plan Note (Signed)
 Stable on Abilify  w lexapro 

## 2023-10-17 NOTE — Assessment & Plan Note (Signed)
 Stable on lexapro.

## 2023-10-21 ENCOUNTER — Other Ambulatory Visit: Payer: Self-pay

## 2023-10-22 ENCOUNTER — Other Ambulatory Visit: Payer: Self-pay

## 2023-10-22 ENCOUNTER — Ambulatory Visit: Attending: Physician Assistant | Admitting: Physician Assistant

## 2023-10-22 ENCOUNTER — Encounter (INDEPENDENT_AMBULATORY_CARE_PROVIDER_SITE_OTHER): Payer: Self-pay | Admitting: Physician Assistant

## 2023-10-22 VITALS — BP 120/82 | HR 77 | Ht 63.0 in | Wt 209.0 lb

## 2023-10-22 DIAGNOSIS — I10 Essential (primary) hypertension: Secondary | ICD-10-CM | POA: Insufficient documentation

## 2023-10-22 DIAGNOSIS — M7989 Other specified soft tissue disorders: Secondary | ICD-10-CM | POA: Insufficient documentation

## 2023-10-22 DIAGNOSIS — T887XXA Unspecified adverse effect of drug or medicament, initial encounter: Secondary | ICD-10-CM | POA: Insufficient documentation

## 2023-10-22 DIAGNOSIS — R454 Irritability and anger: Secondary | ICD-10-CM | POA: Insufficient documentation

## 2023-10-22 DIAGNOSIS — F32A Depression, unspecified: Secondary | ICD-10-CM | POA: Insufficient documentation

## 2023-10-22 MED ORDER — OLMESARTAN 20 MG TABLET
20.0000 mg | ORAL_TABLET | Freq: Every day | ORAL | 3 refills | Status: DC
Start: 2023-10-22 — End: 2024-03-14

## 2023-10-22 NOTE — Progress Notes (Signed)
 INTERNAL MEDICINE, BUILDING A  [address]  [place] [address]  Operated by [facility]  Progress Note    Name: Stacy Cantrell MRN:  [id number]   Date: 10/22/2023 DOB:  July 04, 1980 (43 y.o.)              Chief Complaint: Medication Adjustment      HPI: Stacy Cantrell is a 43 y.o. female who presents to the office today in f/u concerning  blood pressure/hypertension for which she  states the BP has been running in the 140's/90's x 2 weeks w headaches noted.  This am upon waking was 143/91 however in the office today 110/84. She is currently taking Norvasc 5 mg daily.    She also has  Raynaud's which the Norvasc has helped.  Currently at this time she denies any chest pain headaches dizziness. She has noted some leg swelling off and on but no sob.   She has also being  followed by a cardiologist.    She also has complaints of increased anxiety as well as depressed at times. Reports being more irritated lately w small things. She is currently on Lexapro  20 mg daily and has been on med for > 1 year but thinks meds may need to be changed.       Allergies:  Allergies   Allergen Reactions    Codeine Hives/ Urticaria    Diphenhydramine Hcl Hives/ Urticaria    Penicillins Hives/ Urticaria    Diphenhydramine Itching       Current Medications:  ARIPiprazole  (ABILIFY ) 2 mg Oral Tablet, Take 1 Tablet (2 mg total) by mouth Daily  cetirizine  (ZYRTEC ) 10 mg Oral Tablet, TAKE 1 TABLET BY MOUTH EVERY DAY AS NEEDED  cyanocobalamin  (VITAMIN B12) 1,000 mcg/mL Injection Solution, Inject 1 mL (1,000 mcg total) under the skin Every 30 days  escitalopram  oxalate (LEXAPRO ) 20 mg Oral Tablet, Take 1 Tablet (20 mg total) by mouth Once a day  Ibuprofen  (MOTRIN ) 800 mg Oral Tablet, Take 1 Tablet (800 mg total) by mouth Three times a day as needed for Pain  meloxicam (MOBIC) 15 mg Oral Tablet, TAKE 1 TABLET BY MOUTH EVERY DAY AS NEEDED FOR PAIN WITH FOOD  multivitamin-iron-folic acid  (CENTRUM) 18-400 mg-mcg Oral  Tablet, Take 1 Tablet by mouth Once a day (Patient not taking: Reported on 07/08/2023)  ondansetron  (ZOFRAN  ODT) 8 mg Oral Tablet, Rapid Dissolve, Place 1 Tablet (8 mg total) under the tongue Every 8 hours as needed for Nausea/Vomiting  oxyCODONE -acetaminophen  (PERCOCET) 7.5-325 mg Oral Tablet, Take 1 Tablet by mouth Every 6 hours as needed for Pain  temazepam  (RESTORIL ) 7.5 mg Oral Capsule, Take 1 Capsule (7.5 mg total) by mouth Every night as needed for Insomnia  topiramate  (TOPAMAX ) 50 mg Oral Tablet, Take 1 Tablet (50 mg total) by mouth Once a day  amLODIPine  (NORVASC) 5 mg Oral Tablet, TAKE 1 TABLET BY MOUTH ONCE A DAY    No facility-administered medications prior to visit.       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     Allergic rhinitis     Anxiety     COVID-19 vaccine series declined     Depression     Gastroenteritis     Headache     Insomnia disorder            Social History     Tobacco Use    Smoking status: Former  Current packs/day: 0.00     Average packs/day: 0.5 packs/day for 15.7 years (7.9 ttl pk-yrs)     Types: Cigarettes     Start date: 10/06/1998     Quit date: 06/30/2014     Years since quitting: 9.3    Smokeless tobacco: Never   Vaping Use    Vaping status: Never Used   Substance Use Topics    Alcohol use: Yes     Alcohol/week: 4.0 standard drinks of alcohol     Types: 4 Glasses of wine per week    Drug use: Never       OBJECTIVE:  Vitals:    10/22/23 1036 10/22/23 1045   BP: 110/84 120/82   Pulse: 77    SpO2: 99%    Weight: 94.8 kg (209 lb)    Height: 1.6 m (5\' 3" )    BMI: 37.02         Physical Exam  Vitals and nursing note reviewed.   Constitutional:       Appearance: Normal appearance.    Cardiovascular:      Rate and Rhythm: Normal rate and regular rhythm.      Pulses: Normal pulses.   Pulmonary:      Effort: Pulmonary effort is normal.      Breath sounds: Normal breath sounds.   Skin:     General: Skin is warm.      Findings:  No rash.   Neurological:      General: No focal deficit present.      Mental Status: She is alert and oriented to person, place, and time.   Psychiatric:         Mood and Affect: Mood normal.         Behavior: Behavior normal.         Thought Content: Thought content normal.         Judgment: Judgment normal.     Assessment & Plan  Elevated blood pressure reading with diagnosis of hypertension  Question whether Norvasc is for patient in due to side effects of leg swelling us  changing medication which patient is agreeable to.  Will start Benicar  20 mg daily.  She is to continue blood pressure monitoring outpatient.  Leg swelling  Most likely from her Norvasc  Will change med to see if related  Medication side effect    Irritability  Discussed options for medication change.  Will switch Lexapro  to Trintellix 5 mg daily.  Samples of medication given today.  Depression, unspecified depression type        PLAN: Treatment per orders . Call or return to clinic prn if these symptoms worsen or fail to improve as anticipated.  Orders Placed This Encounter    olmesartan  (BENICAR ) 20 mg Oral Tablet      Post-Discharge Follow Up Appointments       Friday Dec 18, 2023    Return Patient Visit with Cloyce Darby, PA-C at  8:30 AM      Internal Medicine, Building A  [address], [place]  [address]  [place] [address]  [phone]             This note was partially created using MModal Fluency Direct system (voice recognition software) and is inherently subject to errors including those of syntax and "sound-alike" substitutions which may escape proofreading.  In such instances, original meaning may be extrapolated by contextual derivation.    Clarisse Rodriges, PA-C

## 2023-10-22 NOTE — Nursing Note (Signed)
 Patient is here with complaints of her blood pressure being elevated and swelling in her legs.

## 2023-10-25 ENCOUNTER — Encounter (INDEPENDENT_AMBULATORY_CARE_PROVIDER_SITE_OTHER): Payer: Self-pay | Admitting: Physician Assistant

## 2023-10-26 ENCOUNTER — Other Ambulatory Visit (INDEPENDENT_AMBULATORY_CARE_PROVIDER_SITE_OTHER): Payer: Self-pay | Admitting: Physician Assistant

## 2023-10-26 DIAGNOSIS — Z1211 Encounter for screening for malignant neoplasm of colon: Secondary | ICD-10-CM

## 2023-11-11 ENCOUNTER — Other Ambulatory Visit (INDEPENDENT_AMBULATORY_CARE_PROVIDER_SITE_OTHER): Payer: Self-pay | Admitting: Physician Assistant

## 2023-11-11 MED ORDER — PHENTERMINE 37.5 MG TABLET
37.5000 mg | ORAL_TABLET | Freq: Every morning | ORAL | 0 refills | Status: DC
Start: 2023-11-11 — End: 2023-12-10

## 2023-11-11 MED ORDER — ESCITALOPRAM 20 MG TABLET
20.0000 mg | ORAL_TABLET | Freq: Every day | ORAL | 1 refills | Status: DC
Start: 2023-11-11 — End: 2024-04-26

## 2023-11-29 ENCOUNTER — Other Ambulatory Visit (INDEPENDENT_AMBULATORY_CARE_PROVIDER_SITE_OTHER): Payer: Self-pay | Admitting: Physician Assistant

## 2023-12-10 ENCOUNTER — Other Ambulatory Visit (INDEPENDENT_AMBULATORY_CARE_PROVIDER_SITE_OTHER): Payer: Self-pay | Admitting: Physician Assistant

## 2023-12-12 ENCOUNTER — Other Ambulatory Visit (INDEPENDENT_AMBULATORY_CARE_PROVIDER_SITE_OTHER): Payer: Self-pay | Admitting: Physician Assistant

## 2023-12-15 MED ORDER — PHENTERMINE 37.5 MG TABLET
37.5000 mg | ORAL_TABLET | Freq: Every morning | ORAL | 0 refills | Status: AC
Start: 2023-12-15 — End: 2024-01-14

## 2023-12-18 ENCOUNTER — Ambulatory Visit (INDEPENDENT_AMBULATORY_CARE_PROVIDER_SITE_OTHER): Payer: Self-pay | Admitting: Physician Assistant

## 2023-12-21 MED ORDER — TEMAZEPAM 7.5 MG CAPSULE
7.5000 mg | ORAL_CAPSULE | Freq: Every evening | ORAL | 0 refills | Status: DC | PRN
Start: 2023-12-21 — End: 2024-03-09

## 2023-12-23 ENCOUNTER — Other Ambulatory Visit: Payer: Self-pay

## 2023-12-23 ENCOUNTER — Ambulatory Visit: Payer: Self-pay | Attending: Physician Assistant | Admitting: Physician Assistant

## 2023-12-23 ENCOUNTER — Encounter (INDEPENDENT_AMBULATORY_CARE_PROVIDER_SITE_OTHER): Payer: Self-pay | Admitting: Physician Assistant

## 2023-12-23 ENCOUNTER — Ambulatory Visit (HOSPITAL_BASED_OUTPATIENT_CLINIC_OR_DEPARTMENT_OTHER)
Admission: RE | Admit: 2023-12-23 | Discharge: 2023-12-23 | Disposition: A | Discharge status: Home / Self Care | Source: Ambulatory Visit | Attending: Physician Assistant | Admitting: Physician Assistant

## 2023-12-23 VITALS — BP 110/72 | HR 110 | Resp 16 | Ht 62.99 in | Wt 214.0 lb

## 2023-12-23 DIAGNOSIS — J069 Acute upper respiratory infection, unspecified: Secondary | ICD-10-CM | POA: Insufficient documentation

## 2023-12-23 DIAGNOSIS — R0989 Other specified symptoms and signs involving the circulatory and respiratory systems: Secondary | ICD-10-CM | POA: Insufficient documentation

## 2023-12-23 DIAGNOSIS — R161 Splenomegaly, not elsewhere classified: Secondary | ICD-10-CM | POA: Insufficient documentation

## 2023-12-23 DIAGNOSIS — I10 Essential (primary) hypertension: Secondary | ICD-10-CM | POA: Insufficient documentation

## 2023-12-23 DIAGNOSIS — E7800 Pure hypercholesterolemia, unspecified: Secondary | ICD-10-CM | POA: Insufficient documentation

## 2023-12-23 DIAGNOSIS — R82998 Other abnormal findings in urine: Secondary | ICD-10-CM | POA: Insufficient documentation

## 2023-12-23 DIAGNOSIS — Z87891 Personal history of nicotine dependence: Secondary | ICD-10-CM

## 2023-12-23 DIAGNOSIS — R5382 Chronic fatigue, unspecified: Secondary | ICD-10-CM | POA: Insufficient documentation

## 2023-12-23 MED ORDER — IPRATROPIUM 0.5 MG-ALBUTEROL 3 MG (2.5 MG BASE)/3 ML NEBULIZATION SOLN
3.0000 mL | INHALATION_SOLUTION | RESPIRATORY_TRACT | 2 refills | Status: DC | PRN
Start: 2023-12-23 — End: 2024-05-18

## 2023-12-23 MED ORDER — TRIAMCINOLONE ACETONIDE 40 MG/ML SUSPENSION FOR INJECTION
40.0000 mg | INTRAMUSCULAR | Status: AC
Start: 2023-12-23 — End: 2023-12-23
  Administered 2023-12-23: 40 mg via INTRAMUSCULAR

## 2023-12-23 MED ORDER — PROMETHAZINE-DM 6.25 MG-15 MG/5 ML ORAL SYRUP
5.0000 mL | ORAL_SOLUTION | Freq: Four times a day (QID) | ORAL | 0 refills | Status: AC | PRN
Start: 2023-12-23 — End: 2023-12-30

## 2023-12-23 MED ORDER — DOXYCYCLINE HYCLATE 100 MG TABLET
100.0000 mg | ORAL_TABLET | Freq: Two times a day (BID) | ORAL | 0 refills | Status: AC
Start: 2023-12-23 — End: 2024-01-02

## 2023-12-23 MED ORDER — PREDNISONE 20 MG TABLET
20.0000 mg | ORAL_TABLET | Freq: Two times a day (BID) | ORAL | 0 refills | Status: AC
Start: 2023-12-23 — End: 2023-12-28

## 2023-12-23 MED ORDER — LIDOCAINE HCL 10 MG/ML (1 %) INJECTION SOLUTION
1.0000 g | INTRAMUSCULAR | Status: AC
Start: 2023-12-23 — End: 2023-12-23
  Administered 2023-12-23: 1 g via INTRAMUSCULAR

## 2023-12-23 MED ORDER — PROCHLORPERAZINE MALEATE 10 MG TABLET: 10.0000 mg | ORAL_TABLET | Freq: Four times a day (QID) | ORAL | 0 refills | Status: DC | PRN

## 2023-12-23 MED ORDER — ALBUTEROL SULFATE 2.5 MG/3 ML (0.083 %) SOLUTION FOR NEBULIZATION
2.5000 mg | INHALATION_SOLUTION | Freq: Once | RESPIRATORY_TRACT | Status: AC
Start: 2023-12-23 — End: 2023-12-23
  Administered 2023-12-23: 2.5 mg via RESPIRATORY_TRACT

## 2023-12-23 NOTE — Progress Notes (Signed)
 INTERNAL MEDICINE, BUILDING A  [address]  [place] [address]  Operated by [facility]  Progress Note    Name: Stacy Cantrell MRN:  [id number]   Date: 12/23/2023 DOB:  [birth year] (43 y.o.)              Chief Complaint: Congestion, Nausea, and Vomiting       HPI: Stacy Cantrell is a 43 y.o. female who complains of  cough n/v/ d x 2 weeks. States went to the ER  weekend  before last and they swabbed her for covid and flu and told neg but no one ever came back to see her so she left. She's been having issues w stomach and seeing Dr. Tobie who just did an us  of her abd and told spleen enlarged her GI symptoms.  She does have follow-up with him scheduled.  and she no longer has her gallbladder so not sure if that has anything to do with Was around her grand babies who had cough /cong sx so thinks they passed it to her.  +thick yellow green sputum w occ blood ting but has been coughing harshly x 2 weeks.   She also reports that her urine has been a little darker than normal but does not burn or have odor.  She admits to not drinking as much fluids as she should since she does not feel well.    Follow-up blood pressure/hypertension for which she is currently taking Norvasc 5 mg daily.  Blood pressure today is stable at 110/72.  She also has  Raynaud's which the Norvasc has helped.  Currently at this time she denies any chest pain headaches dizziness.   She is also being followed by a cardiologist.    F/u elevated chol w pt currently not on chol med. Continues to try and watch diet. F/u labs needed.    Follow-up chronic anxiety/depression with patient currently taking Lexapro  20 mg daily and Abilify  2mg  daily.  She denies panic attacks or new concerns but once again has a lot going on so anxiety may be a little worse lately.  She also suffers from insomnia for which she is using restoril  7.5mg  q hs which does help.     Allergies:  Allergies[1]    Current Medications:  amLODIPine   (NORVASC) 5 mg Oral Tablet, TAKE 1 TABLET BY MOUTH ONCE A DAY (Patient not taking: Reported on 12/23/2023)  ARIPiprazole  (ABILIFY ) 2 mg Oral Tablet, Take 1 Tablet (2 mg total) by mouth Daily  cetirizine  (ZYRTEC ) 10 mg Oral Tablet, TAKE 1 TABLET BY MOUTH EVERY DAY AS NEEDED  cyanocobalamin  (VITAMIN B12) 1,000 mcg/mL Injection Solution, Inject 1 mL (1,000 mcg total) under the skin Every 30 days  escitalopram  oxalate (LEXAPRO ) 20 mg Oral Tablet, Take 1 Tablet (20 mg total) by mouth Daily  Ibuprofen  (MOTRIN ) 800 mg Oral Tablet, Take 1 Tablet (800 mg total) by mouth Three times a day as needed for Pain  meloxicam (MOBIC) 15 mg Oral Tablet, TAKE 1 TABLET BY MOUTH EVERY DAY AS NEEDED FOR PAIN WITH FOOD  multivitamin-iron-folic acid  (CENTRUM) 18-400 mg-mcg Oral Tablet, Take 1 Tablet by mouth Once a day  olmesartan  (BENICAR ) 20 mg Oral Tablet, Take 1 Tablet (20 mg total) by mouth Daily  ondansetron  (ZOFRAN  ODT) 8 mg Oral Tablet, Rapid Dissolve, Place 1 Tablet (8 mg total) under the tongue Every 8 hours as needed for Nausea/Vomiting  oxyCODONE -acetaminophen  (PERCOCET) 7.5-325 mg Oral Tablet, Take 1 Tablet by mouth Every 6 hours  as needed for Pain (Patient not taking: Reported on 12/23/2023)  phentermine  (ADIPEX-P ) 37.5 mg Oral Tablet, Take 1 Tablet (37.5 mg total) by mouth Every morning before breakfast for 30 days  temazepam  (RESTORIL ) 7.5 mg Oral Capsule, Take 1 Capsule (7.5 mg total) by mouth Every night as needed for Insomnia  topiramate  (TOPAMAX ) 50 mg Oral Tablet, Take 1 Tablet (50 mg total) by mouth Once a day    No facility-administered medications prior to visit.       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   Constitutional:       Appearance: Normal appearance.   HENT:      Right Ear: Tympanic membrane normal.      Left Ear: Tympanic membrane normal.      Nose: No congestion.      Mouth/Throat:      Mouth: Mucous membranes are moist.      Pharynx: Oropharynx is clear.   Eyes:      Pupils: Pupils are equal, round, and reactive to light.   Cardiovascular:      Rate and Rhythm: Normal rate and regular rhythm.      Pulses: Normal pulses.   Pulmonary:      Effort: Pulmonary effort is normal.      Breath sounds: Rhonchi present.      Comments: Rattling congested cough noted  Abdominal:      Palpations: Abdomen is soft.      Tenderness: There is no abdominal tenderness.   Musculoskeletal:         General: Normal range of motion.      Cervical back: Normal range of motion and neck supple.   Lymphadenopathy:      Cervical: No cervical adenopathy.   Skin:     General: Skin is warm.      Findings: No rash.   Neurological:      Mental Status: She is alert and oriented to person, place, and time.   Psychiatric:         Mood and Affect: Mood normal.         Behavior: Behavior normal.         Thought Content: Thought content normal.         Judgment: Judgment normal.        Urine Dip Results:      negative     Assessment & Plan  Upper respiratory tract infection, unspecified type  Albuterol  nebulizer administered in office with improvement in symptoms noted so will continue DuoNebs outpatient PRN as directed  Rocephin  1 g IM x1  Will start doxycycline  100 mg b.i.d. times 10 days tomorrow  Prometh  DM p.r.n. cough  Chest x-ray ordered  Chest congestion  Kenalog  injection given IM x1  Start prednisone   20 b.i.d. x5 days tomorrow  Hypertension, unspecified type  Blood pressure stable on Norvasc  Continue monitoring  Elevated cholesterol  Labs obtained in office to check cholesterol level  Continue diet  weight management  Chronic fatigue  Increase rest fluids vitamin-C zinc  Labs also obtained in office  Spleen enlargement  Will check EBV in lab  Continue following with GI  Dark urine  Urinalysis obtained in office noted to be unremarkable  Increase hydration       PLAN: Treatment per orders . Call or return to clinic prn if these symptoms worsen or fail to improve as anticipated.  Orders Placed This Encounter    XR CHEST PA AND LATERAL    THYROID  STIMULATING HORMONE WITH FREE T4 REFLEX    CBC/DIFF    COMPREHENSIVE METABOLIC PNL, FASTING    IRON TRANSFERRIN AND TIBC    LIPID PANEL    EPSTEIN BARR (EBV) DNA, QUANTITATIVE PCR, PLASMA    CBC WITH DIFF    POCT URINE DIPSTICK    albuterol  (PROVENTIL ) 2.5 mg / 3 mL (0.083%) neb solution    triamcinolone  acetonide (KENALOG -40) 40 mg/mL injection    predniSONE  (DELTASONE ) 20 mg Oral Tablet    doxycycline  hyclate 100 mg Oral Tablet    cefTRIAXone  (ROCEPHIN ) 1 g in lidocaine  2.86 mL (tot vol) IM injection    prochlorperazine  (COMPAZINE ) 10 mg Oral Tablet    promethazine -dextromethorphan (PHENERGAN -DM) 6.25-15 mg/5 mL Oral Syrup    ipratropium-albuterol  0.5 mg-3 mg(2.5 mg base)/3 mL Solution for Nebulization      Post-Discharge Follow Up Appointments       Thursday Mar 24, 2024    Return Patient Visit with Scharlene No, PA-C at  2:30 PM      Monday May 09, 2024    Return Patient Visit with Scharlene No, PA-C at  [facility] PM      Internal Medicine, Building A  [address], [place]  [address]  [place] [address]  [phone]             This note was partially created using MModal Fluency Direct system (voice recognition software) and is inherently subject to errors including those of syntax and sound-alike substitutions which may escape proofreading.  In such instances, original meaning may be extrapolated by contextual derivation.    Javarion Douty, PA-C           [1]   Allergies  Allergen Reactions    Codeine Hives/ Urticaria    Diphenhydramine Hcl Hives/ Urticaria     Penicillins Hives/ Urticaria    Diphenhydramine Itching   [2]   Social History  Tobacco Use    Smoking status: Former     Current packs/day: 0.00     Average packs/day: 0.5 packs/day for 15.7 years (7.9 ttl pk-yrs)     Types: Cigarettes     Start date: 10/06/1998     Quit date: 06/30/2014     Years since quitting: 9.4    Smokeless tobacco: Never   Vaping Use    Vaping status: Never Used   Substance Use Topics    Alcohol use: Yes     Alcohol/week: 4.0 standard drinks of alcohol     Types: 4 Glasses of wine per week    Drug use: Never

## 2023-12-23 NOTE — Assessment & Plan Note (Signed)
 Increase rest fluids vitamin-C zinc  Labs also obtained in office

## 2023-12-23 NOTE — Nursing Note (Signed)
 12/23/23 1600   Urine Test   Performed Status: Automated   Time collected 0423   Manufacturer Siemens   Urine Test - Siemens   Color (Ref Range: Yellow) Yellow   Clarity (Ref Range: Clear) Clear   Glucose (Ref Range: Negative mg/dL) Negative   Bilirubin (Ref Range: Negative mg/dL) Negative   Ketones (Ref Range: Negative mg/dL) Negative   Urine Specific Gravity (Ref Range: 1.005 - 1.030) 1.015   Blood (urine) (Ref Range: Negative mg/dL) Negative   pH (Ref Range: 5.0 - 8.0) 6.0   Protein (Ref Range: Negative mg/dL) Negative   Urobilinogen (Ref Range: Normal) 0.2mg /dL (Normal)   Nitrite (Ref Range: Negative) Negative   Leukocytes (Ref Range: Negative WBC's/uL) Negative   Initials KD, CMA

## 2023-12-23 NOTE — Assessment & Plan Note (Signed)
 Blood pressure stable on Norvasc  Continue monitoring

## 2023-12-23 NOTE — Assessment & Plan Note (Signed)
 Labs obtained in office to check cholesterol level  Continue diet weight management

## 2023-12-24 ENCOUNTER — Ambulatory Visit (INDEPENDENT_AMBULATORY_CARE_PROVIDER_SITE_OTHER): Payer: Self-pay

## 2023-12-24 ENCOUNTER — Other Ambulatory Visit: Attending: Physician Assistant | Admitting: Physician Assistant

## 2023-12-24 DIAGNOSIS — R7309 Other abnormal glucose: Secondary | ICD-10-CM

## 2023-12-24 LAB — LIPID PANEL
CHOL/HDL RATIO: 4.4
CHOLESTEROL: 148 mg/dL (ref <200)
HDL CHOL: 34 mg/dL — ABNORMAL LOW (ref >=40)
LDL CALC: 73 mg/dL (ref 0–100)
TRIGLYCERIDES: 206 mg/dL — ABNORMAL HIGH (ref <=150)
VLDL CALC: 41 mg/dL (ref 0–50)

## 2023-12-24 LAB — CBC WITH DIFF
BASOPHIL #: 0 10*3/uL (ref 0.00–0.10)
BASOPHIL %: 1 % (ref 0–1)
EOSINOPHIL #: 0.3 10*3/uL (ref 0.00–0.50)
EOSINOPHIL %: 6 % (ref 1–7)
HCT: 39.5 % (ref 31.2–41.9)
HGB: 14 g/dL (ref 10.9–14.3)
LYMPHOCYTE #: 1.9 10*3/uL (ref 1.10–3.10)
LYMPHOCYTE %: 35 % (ref 16–46)
MCH: 29.5 pg (ref 24.7–32.8)
MCHC: 35.3 g/dL (ref 32.3–35.6)
MCV: 83.5 fL (ref 75.5–95.3)
MONOCYTE #: 0.5 10*3/uL (ref 0.20–0.90)
MONOCYTE %: 9 % (ref 4–11)
MPV: 7.2 fL — ABNORMAL LOW (ref 7.9–10.8)
NEUTROPHIL #: 2.7 10*3/uL (ref 1.90–8.20)
NEUTROPHIL %: 50 % (ref 43–77)
PLATELETS: 259 10*3/uL (ref 140–440)
RBC: 4.74 10*6/uL (ref 3.63–4.92)
RDW: 12.7 % (ref 12.3–17.7)
WBC: 5.4 10*3/uL (ref 3.8–11.8)

## 2023-12-24 LAB — COMPREHENSIVE METABOLIC PNL, FASTING
ALBUMIN/GLOBULIN RATIO: 1.5 — ABNORMAL HIGH (ref 0.8–1.4)
ALBUMIN: 4.7 g/dL (ref 3.5–5.7)
ALKALINE PHOSPHATASE: 61 U/L (ref 34–104)
ALT (SGPT): 34 U/L (ref 7–52)
ANION GAP: 9 mmol/L (ref 4–13)
AST (SGOT): 29 U/L (ref 13–39)
BILIRUBIN TOTAL: 0.5 mg/dL (ref 0.3–1.0)
BUN/CREA RATIO: 11 (ref 6–22)
BUN: 8 mg/dL (ref 7–25)
CALCIUM, CORRECTED: 9.1 mg/dL (ref 8.9–10.8)
CALCIUM: 9.7 mg/dL (ref 8.6–10.3)
CHLORIDE: 104 mmol/L (ref 98–107)
CO2 TOTAL: 27 mmol/L (ref 21–31)
CREATININE: 0.71 mg/dL (ref 0.60–1.30)
ESTIMATED GFR: 109 mL/min/{1.73_m2} (ref >59)
GLOBULIN: 3.1 (ref 2.0–3.5)
GLUCOSE: 126 mg/dL — ABNORMAL HIGH (ref 74–109)
OSMOLALITY, CALCULATED: 279 mosm/kg (ref 270–290)
POTASSIUM: 3.5 mmol/L (ref 3.5–5.1)
PROTEIN TOTAL: 7.8 g/dL (ref 6.4–8.9)
SODIUM: 140 mmol/L (ref 136–145)

## 2023-12-24 LAB — IRON TRANSFERRIN AND TIBC
IRON (TRANSFERRIN) SATURATION: 14 % — ABNORMAL LOW (ref 15–50)
IRON: 46 ug/dL — ABNORMAL LOW (ref 50–212)
TOTAL IRON BINDING CAPACITY: 333 ug/dL (ref 250–450)
TRANSFERRIN: 238 mg/dL (ref 203–362)
UIBC: 287 ug/dL (ref 130–375)

## 2023-12-24 LAB — THYROID STIMULATING HORMONE WITH FREE T4 REFLEX: TSH: 1.028 u[IU]/mL (ref 0.450–5.330)

## 2023-12-24 NOTE — Telephone Encounter (Signed)
-----   Message from Greig Seats, [place] sent at 12/24/2023 11:59 AM EDT -----  Glucose was 126 see if we can add A1c level please  Iron is low with sat 14% is she on iron?  Cholesterol ratio and triglycerides are up is she on anything for cholesterol?  Needs to work on diet weight loss  ----- Message -----  From: Lab, Background User  Sent: 12/24/2023   9:19 AM EDT  To: Greig Seats, PA-C

## 2023-12-25 LAB — HGA1C (HEMOGLOBIN A1C WITH EST AVG GLUCOSE): HEMOGLOBIN A1C: 5 % (ref 4.0–6.0)

## 2023-12-25 LAB — EPSTEIN BARR (EBV) DNA, QUANTITATIVE PCR, PLASMA: EBV QUANTITATIVE PCR: NOT DETECTED

## 2023-12-25 MED ORDER — ROSUVASTATIN 5 MG TABLET
5.0000 mg | ORAL_TABLET | Freq: Every evening | ORAL | 1 refills | Status: DC
Start: 2023-12-25 — End: 2024-04-26

## 2023-12-25 NOTE — Addendum Note (Signed)
 Addended by: Cheri Ayotte R on: 12/25/2023 07:07 AM     Modules accepted: Orders

## 2023-12-31 ENCOUNTER — Ambulatory Visit: Attending: Family

## 2023-12-31 ENCOUNTER — Other Ambulatory Visit: Payer: Self-pay

## 2023-12-31 ENCOUNTER — Telehealth (INDEPENDENT_AMBULATORY_CARE_PROVIDER_SITE_OTHER): Payer: Self-pay | Admitting: Physician Assistant

## 2023-12-31 DIAGNOSIS — R935 Abnormal findings on diagnostic imaging of other abdominal regions, including retroperitoneum: Secondary | ICD-10-CM | POA: Insufficient documentation

## 2023-12-31 LAB — HEPATIC FUNCTION PANEL
ALBUMIN/GLOBULIN RATIO: 1.4 (ref 0.8–1.4)
ALBUMIN: 4.5 g/dL (ref 3.5–5.7)
ALKALINE PHOSPHATASE: 60 U/L (ref 34–104)
ALT (SGPT): 24 U/L (ref 7–52)
AST (SGOT): 18 U/L (ref 13–39)
BILIRUBIN DIRECT: 0.07 md/dL (ref 0.03–0.18)
BILIRUBIN TOTAL: 0.4 mg/dL (ref 0.3–1.0)
BILIRUBIN, INDIRECT: 0.33 mg/dL (ref <=1)
GLOBULIN: 3.3 (ref 2.0–3.5)
PROTEIN TOTAL: 7.8 g/dL (ref 6.4–8.9)

## 2024-01-12 ENCOUNTER — Other Ambulatory Visit (INDEPENDENT_AMBULATORY_CARE_PROVIDER_SITE_OTHER): Payer: Self-pay | Admitting: Physician Assistant

## 2024-01-12 MED ORDER — METFORMIN 500 MG TABLET
500.0000 mg | ORAL_TABLET | Freq: Every morning | ORAL | 2 refills | Status: DC
Start: 2024-01-12 — End: 2024-02-11

## 2024-02-03 ENCOUNTER — Encounter (HOSPITAL_COMMUNITY): Payer: Self-pay

## 2024-02-11 ENCOUNTER — Other Ambulatory Visit (INDEPENDENT_AMBULATORY_CARE_PROVIDER_SITE_OTHER): Payer: Self-pay | Admitting: Physician Assistant

## 2024-02-11 MED ORDER — METFORMIN 500 MG TABLET
500.0000 mg | ORAL_TABLET | Freq: Every morning | ORAL | 1 refills | Status: DC
Start: 2024-02-11 — End: 2024-04-26

## 2024-03-08 ENCOUNTER — Other Ambulatory Visit: Payer: Self-pay

## 2024-03-09 ENCOUNTER — Encounter (INDEPENDENT_AMBULATORY_CARE_PROVIDER_SITE_OTHER): Payer: Self-pay | Admitting: Physician Assistant

## 2024-03-09 ENCOUNTER — Ambulatory Visit: Payer: Self-pay | Attending: Physician Assistant | Admitting: Physician Assistant

## 2024-03-09 VITALS — BP 132/88 | HR 91 | Ht 62.0 in | Wt 228.0 lb

## 2024-03-09 DIAGNOSIS — I872 Venous insufficiency (chronic) (peripheral): Secondary | ICD-10-CM

## 2024-03-09 DIAGNOSIS — E559 Vitamin D deficiency, unspecified: Secondary | ICD-10-CM

## 2024-03-09 DIAGNOSIS — Z87891 Personal history of nicotine dependence: Secondary | ICD-10-CM

## 2024-03-09 DIAGNOSIS — I10 Essential (primary) hypertension: Secondary | ICD-10-CM | POA: Insufficient documentation

## 2024-03-09 DIAGNOSIS — E7800 Pure hypercholesterolemia, unspecified: Secondary | ICD-10-CM

## 2024-03-09 DIAGNOSIS — R7309 Other abnormal glucose: Secondary | ICD-10-CM | POA: Insufficient documentation

## 2024-03-09 DIAGNOSIS — G4700 Insomnia, unspecified: Secondary | ICD-10-CM

## 2024-03-09 DIAGNOSIS — F419 Anxiety disorder, unspecified: Secondary | ICD-10-CM | POA: Insufficient documentation

## 2024-03-09 DIAGNOSIS — R635 Abnormal weight gain: Secondary | ICD-10-CM

## 2024-03-09 DIAGNOSIS — Z79899 Other long term (current) drug therapy: Secondary | ICD-10-CM

## 2024-03-09 DIAGNOSIS — Z7984 Long term (current) use of oral hypoglycemic drugs: Secondary | ICD-10-CM

## 2024-03-09 LAB — DRUG SCREEN, NO CONFIRMATION, URINE
AMPHETAMINES URINE: NEGATIVE
BARBITURATES URINE: NEGATIVE
BENZODIAZEPINES URINE: NEGATIVE
BUPRENORPHINE URINE: NEGATIVE
CANNABINOIDS URINE: NEGATIVE
COCAINE METABOLITES URINE: NEGATIVE
FENTANYL, URINE: NEGATIVE
METHADONE URINE: NEGATIVE
OPIATES URINE: NEGATIVE
OXYCODONE URINE: NEGATIVE
PCP URINE: NEGATIVE

## 2024-03-09 LAB — URINALYSIS, MACROSCOPIC
BILIRUBIN: NEGATIVE mg/dL
BLOOD: NEGATIVE mg/dL
GLUCOSE: NEGATIVE mg/dL
KETONES: NEGATIVE mg/dL
LEUKOCYTES: NEGATIVE WBCs/uL
NITRITE: NEGATIVE
PH: 5.5 (ref 5.0–9.0)
PROTEIN: NEGATIVE mg/dL
SPECIFIC GRAVITY: 1.004 (ref 1.002–1.030)
UROBILINOGEN: NORMAL mg/dL

## 2024-03-09 LAB — MICROALBUMIN/CREATININE RATIO, URINE, RANDOM
CREATININE RANDOM URINE: 11 mg/dL — ABNORMAL LOW (ref 30–125)
MICROALBUMIN RANDOM URINE: <0.7 mg/dL

## 2024-03-09 LAB — IRON TRANSFERRIN AND TIBC
IRON (TRANSFERRIN) SATURATION: 21 % (ref 15–50)
IRON: 76 ug/dL (ref 50–212)
TOTAL IRON BINDING CAPACITY: 365 ug/dL (ref 250–450)
TRANSFERRIN: 261 mg/dL (ref 203–362)
UIBC: 289 ug/dL (ref 130–375)

## 2024-03-09 LAB — URINALYSIS, MICROSCOPIC
SQUAMOUS EPITHELIAL: 4 /HPF (ref <28)
WBCS: <1 /HPF (ref <6)

## 2024-03-09 LAB — CBC WITH DIFF
BASOPHIL #: 0 x10ˆ3/uL (ref 0.00–0.10)
BASOPHIL %: 0 % (ref 0–1)
EOSINOPHIL #: 0.1 x10ˆ3/uL (ref 0.00–0.50)
EOSINOPHIL %: 1 % (ref 1–7)
HCT: 41.2 % (ref 31.2–41.9)
HGB: 14.1 g/dL (ref 10.9–14.3)
LYMPHOCYTE #: 1.4 x10ˆ3/uL (ref 1.10–3.10)
LYMPHOCYTE %: 19 % (ref 16–46)
MCH: 29 pg (ref 24.7–32.8)
MCHC: 34.3 g/dL (ref 32.3–35.6)
MCV: 84.6 fL (ref 75.5–95.3)
MONOCYTE #: 0.5 x10ˆ3/uL (ref 0.20–0.90)
MONOCYTE %: 7 % (ref 4–11)
MPV: 7.1 fL — ABNORMAL LOW (ref 7.9–10.8)
NEUTROPHIL #: 5.1 x10ˆ3/uL (ref 1.90–8.20)
NEUTROPHIL %: 72 % (ref 43–77)
PLATELETS: 268 x10ˆ3/uL (ref 140–440)
RBC: 4.86 x10ˆ6/uL (ref 3.63–4.92)
RDW: 13.3 % (ref 12.3–17.7)
WBC: 7.1 x10ˆ3/uL (ref 3.8–11.8)

## 2024-03-09 LAB — COMPREHENSIVE METABOLIC PNL, FASTING
ALBUMIN/GLOBULIN RATIO: 1.5 — ABNORMAL HIGH (ref 0.8–1.4)
ALBUMIN: 4.3 g/dL (ref 3.5–5.7)
ALKALINE PHOSPHATASE: 53 U/L (ref 34–104)
ALT (SGPT): 29 U/L (ref 7–52)
ANION GAP: 6 mmol/L (ref 4–13)
AST (SGOT): 25 U/L (ref 13–39)
BILIRUBIN TOTAL: 0.4 mg/dL (ref 0.3–1.0)
BUN/CREA RATIO: 12 (ref 6–22)
BUN: 9 mg/dL (ref 7–25)
CALCIUM, CORRECTED: 9 mg/dL (ref 8.9–10.8)
CALCIUM: 9.2 mg/dL (ref 8.6–10.3)
CHLORIDE: 107 mmol/L (ref 98–107)
CO2 TOTAL: 26 mmol/L (ref 21–31)
CREATININE: 0.76 mg/dL (ref 0.60–1.30)
ESTIMATED GFR: 100 mL/min/1.73mˆ2 (ref >59)
GLOBULIN: 2.9 (ref 2.0–3.5)
GLUCOSE: 101 mg/dL (ref 74–109)
OSMOLALITY, CALCULATED: 276 mosm/kg (ref 270–290)
POTASSIUM: 3.7 mmol/L (ref 3.5–5.1)
PROTEIN TOTAL: 7.2 g/dL (ref 6.4–8.9)
SODIUM: 139 mmol/L (ref 136–145)

## 2024-03-09 LAB — LIPID PANEL
CHOL/HDL RATIO: 2.3
CHOLESTEROL: 164 mg/dL (ref <200)
HDL CHOL: 70 mg/dL (ref >=40)
LDL CALC: 69 mg/dL (ref 0–100)
TRIGLYCERIDES: 126 mg/dL (ref <=150)
VLDL CALC: 25 mg/dL (ref 0–50)

## 2024-03-09 LAB — MAGNESIUM: MAGNESIUM: 2 mg/dL (ref 1.9–2.7)

## 2024-03-09 LAB — VITAMIN D 25 TOTAL: VITAMIN D 25, TOTAL: 29.3 ng/mL — ABNORMAL LOW (ref 30.00–100.00)

## 2024-03-09 MED ORDER — TEMAZEPAM 7.5 MG CAPSULE
7.5000 mg | ORAL_CAPSULE | Freq: Every evening | ORAL | 0 refills | Status: DC | PRN
Start: 2024-03-09 — End: 2024-05-17

## 2024-03-09 NOTE — Progress Notes (Signed)
 INTERNAL MEDICINE, BUILDING A  [address]  [place] [address]  Operated by [facility]  Progress Note    Name: Stacy Cantrell MRN:  [id number]   Date: 03/09/2024 DOB:  1980/10/24 (43 y.o.)              Chief Complaint: Follow Up 3 Months (Routine )     This note was created with assistance from [employer] via capture of conversational audio.  Consent was obtained from the patient prior to recording.      History of Present Illness  [name] is a 43 year old female with diabetes and hypertension who presents with elevated blood sugar and blood pressure levels.    Her blood sugar levels have been elevated, with recent readings of 155, 140, 142, 138, and 132. She is currently taking metformin  500 mg in the morning. No dietary changes have been made, and she mentions eating a lot of salads. There is a family history of diabetes.    Her blood pressure has also been elevated, with recent readings of 144/92 and 142/101. She is not currently taking Norvasc due to swelling side effects and is on Benicar  20 mg. She reports persistent significant swelling, despite not being on her feet all day. No high salt intake is reported, and she wears compression pants.    She has experienced a weight gain from 214 lbs to 228 lbs. She denies eating a lot of salt and notes that she eats fruit and vegetables, avoiding 'shake stuff'. She wants to return to a weight of 200 lbs.     F/u elevated chol w pt currently not on chol med. Continues to try and watch diet. F/u labs needed.    Follow-up chronic anxiety/depression with patient currently taking Lexapro  20 mg daily and Abilify  2mg  daily.  She denies panic attacks or new concerns but once again has a lot going on so anxiety may be a little worse lately.  She also suffers from insomnia for which she is using restoril  7.5mg  q hs which does help.     Allergies:  Allergies[1]    Current Medications:  multivitamin-iron-folic acid  (CENTRUM) 18-400 mg-mcg  Oral Tablet, Take 1 Tablet by mouth Once a day  topiramate  (TOPAMAX ) 50 mg Oral Tablet, Take 1 Tablet (50 mg total) by mouth Once a day  amLODIPine  (NORVASC) 5 mg Oral Tablet, TAKE 1 TABLET BY MOUTH ONCE A DAY (Patient not taking: Reported on 12/23/2023)  ARIPiprazole  (ABILIFY ) 2 mg Oral Tablet, Take 1 Tablet (2 mg total) by mouth Daily  cetirizine  (ZYRTEC ) 10 mg Oral Tablet, TAKE 1 TABLET BY MOUTH EVERY DAY AS NEEDED  cyanocobalamin  (VITAMIN B12) 1,000 mcg/mL Injection Solution, Inject 1 mL (1,000 mcg total) under the skin Every 30 days  escitalopram  oxalate (LEXAPRO ) 20 mg Oral Tablet, Take 1 Tablet (20 mg total) by mouth Daily  Ibuprofen  (MOTRIN ) 800 mg Oral Tablet, Take 1 Tablet (800 mg total) by mouth Three times a day as needed for Pain  ipratropium-albuterol  0.5 mg-3 mg(2.5 mg base)/3 mL Solution for Nebulization, Take 3 mL by nebulization Every 4 hours as needed for Wheezing (Patient not taking: Reported on 05/11/2024)  meloxicam (MOBIC) 15 mg Oral Tablet, TAKE 1 TABLET BY MOUTH EVERY DAY AS NEEDED FOR PAIN WITH FOOD  metFORMIN  (GLUCOPHAGE ) 500 mg Oral Tablet, Take 1 Tablet (500 mg total) by mouth Every morning with breakfast  olmesartan  (BENICAR ) 20 mg Oral Tablet, Take 1 Tablet (20 mg total) by mouth Daily  ondansetron  (ZOFRAN  ODT) 8 mg Oral Tablet, Rapid Dissolve, Place 1 Tablet (8 mg total) under the tongue Every 8 hours as needed for Nausea/Vomiting  oxyCODONE -acetaminophen  (PERCOCET) 7.5-325 mg Oral Tablet, Take 1 Tablet by mouth Every 6 hours as needed for Pain (Patient not taking: Reported on 12/23/2023)  prochlorperazine  (COMPAZINE ) 10 mg Oral Tablet, Take 1 Tablet (10 mg total) by mouth Every 6 hours as needed for Nausea/Vomiting  rosuvastatin  (CRESTOR ) 5 mg Oral Tablet, Take 1 Tablet (5 mg total) by mouth Every evening  temazepam  (RESTORIL ) 7.5 mg Oral Capsule, Take 1 Capsule (7.5 mg total) by mouth Every night as needed for Insomnia    No facility-administered medications prior to visit.       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   Constitutional:       Appearance: Normal appearance.   HENT:      Right Ear: Tympanic membrane normal.      Left Ear: Tympanic membrane normal.      Nose: No congestion.      Mouth/Throat:      Mouth: Mucous membranes are moist.      Pharynx: Oropharynx is clear.   Eyes:      Pupils: Pupils are equal, round, and reactive to light.   Cardiovascular:      Rate and Rhythm: Normal rate and regular rhythm.      Pulses: Normal pulses.   Pulmonary:      Effort: Pulmonary effort is normal.      Breath sounds: Normal breath sounds.   Abdominal:      Palpations: Abdomen is soft.      Tenderness: There is no abdominal tenderness.   Musculoskeletal:         General: Normal range of motion.      Cervical back: Normal range of motion and neck supple.   Lymphadenopathy:      Cervical: No cervical adenopathy.   Skin:     General: Skin is warm.      Findings: No rash.   Neurological:      Mental Status: She is alert and oriented to person, place, and time.   Psychiatric:         Mood and Affect: Mood normal.         Behavior: Behavior normal.         Thought Content: Thought content normal.         Judgment: Judgment normal.        Assessment & Plan  Elevated glucose  Type 2 diabetes mellitus with reports Blood sugar levels elevated. On metformin  500 mg daily.   - Check A1c to assess glycemic control.  -continue metformin  daily along with diet weight management  Hypertension, unspecified type  Elevated blood pressure with history of  hypertension noted.  Patient currently on Benicar  20 mg daily.  -increase Benicar  40 mg daily  -continue blood pressure monitoring  Weight gain  Weight gain reported with increased leg swelling also noted possibly contributing.  -obtain routine blood work  -encouraged strict diet exercise weight loss efforts  Venous insufficiency  Chronic venous insufficiency with reports of increased Swelling and visible veins present. Compression garments in use.  -encouraged to decrease sodium in diet  - Continue wearing compression garments.  -elevation of legs while sitting  Elevated cholesterol  History of elevated cholesterol with current use of Crestor  5 mg nightly.  Weight gain once again reported.  -obtain cholesterol levels in blood work  -continue Crestor  5 mg nightly with diet  Anxiety  Chronic anxiety stable with use of Lexapro  and Abilify .  -continue current medication  Insomnia, unspecified type  Chronic insomnia stable with use of Restoril  7.5 mg nightly.  -continue Restoril  as directed nightly PRN  Vitamin D  deficiency  Vitamin-D deficiency with patient currently on vitamin-D supplement.  -obtain vitamin-D level and blood work  -continue vitamin-D supplement  Long-term use of high-risk medication  Chronic use of controlled medication/Restoril .  -UDS obtained       Orders Placed This Encounter    CBC/DIFF    COMPREHENSIVE METABOLIC PNL, FASTING    HGA1C (HEMOGLOBIN A1C WITH EST AVG GLUCOSE)    URINALYSIS, MACROSCOPIC AND MICROSCOPIC W/CULTURE REFLEX    LIPID PANEL    MICROALBUMIN/CREATININE RATIO, URINE, RANDOM    IRON TRANSFERRIN AND TIBC    VITAMIN D  25 TOTAL    MAGNESIUM    DRUG SCREEN, NO CONFIRMATION, URINE    CBC WITH DIFF    URINALYSIS, MACROSCOPIC    URINALYSIS, MICROSCOPIC      Post-Discharge Follow Up Appointments       Thursday Mar 24, 2024    Return Patient Visit with Scharlene No, PA-C at  2:30 PM      Monday May 09, 2024    Return Patient Visit with Scharlene No, PA-C at  [facility] PM      Internal Medicine,  Building A  [address], [place]  [address]  [place] [address]  [phone]             This note was partially created using MModal Fluency Direct system (voice recognition software) and is inherently subject to errors including those of syntax and sound-alike substitutions which may escape proofreading.  In such instances, original meaning may be extrapolated by contextual derivation.    Deklen Popelka, PA-C             [1]   Allergies  Allergen Reactions    Codeine Hives/ Urticaria    Diphenhydramine Hcl Hives/ Urticaria    Penicillins Hives/ Urticaria    Diphenhydramine Itching   [2]   Social History  Tobacco Use    Smoking status: Former     Current packs/day: 0.00     Average packs/day: 0.5 packs/day for 15.7 years (7.9 ttl pk-yrs)     Types: Cigarettes     Start date: 10/06/1998     Quit date: 06/30/2014     Years since quitting: 9.9    Smokeless tobacco: Never   Vaping Use    Vaping status: Never Used   Substance Use Topics    Alcohol use: Yes     Alcohol/week: 4.0 standard drinks of alcohol     Types: 4 Glasses of wine per week  Drug use: Never

## 2024-03-09 NOTE — Nursing Note (Signed)
 Patient presents today as a routine 3 month follow up for medication refills.

## 2024-03-10 ENCOUNTER — Ambulatory Visit (INDEPENDENT_AMBULATORY_CARE_PROVIDER_SITE_OTHER): Payer: Self-pay

## 2024-03-10 LAB — HGA1C (HEMOGLOBIN A1C WITH EST AVG GLUCOSE): HEMOGLOBIN A1C: 5.5 % (ref 4.0–6.0)

## 2024-03-12 ENCOUNTER — Other Ambulatory Visit (INDEPENDENT_AMBULATORY_CARE_PROVIDER_SITE_OTHER): Payer: Self-pay | Admitting: Physician Assistant

## 2024-03-24 ENCOUNTER — Ambulatory Visit (INDEPENDENT_AMBULATORY_CARE_PROVIDER_SITE_OTHER): Payer: Self-pay | Admitting: Physician Assistant

## 2024-03-27 ENCOUNTER — Emergency Department
Admission: EM | Admit: 2024-03-27 | Discharge: 2024-03-27 | Disposition: A | Discharge status: Home / Self Care | Attending: Emergency Medicine | Admitting: Emergency Medicine

## 2024-03-27 ENCOUNTER — Emergency Department (HOSPITAL_BASED_OUTPATIENT_CLINIC_OR_DEPARTMENT_OTHER)

## 2024-03-27 ENCOUNTER — Other Ambulatory Visit: Payer: Self-pay

## 2024-03-27 ENCOUNTER — Encounter (HOSPITAL_BASED_OUTPATIENT_CLINIC_OR_DEPARTMENT_OTHER): Payer: Self-pay

## 2024-03-27 DIAGNOSIS — S39012A Strain of muscle, fascia and tendon of lower back, initial encounter: Secondary | ICD-10-CM | POA: Insufficient documentation

## 2024-03-27 DIAGNOSIS — S5010XA Contusion of unspecified forearm, initial encounter: Secondary | ICD-10-CM

## 2024-03-27 DIAGNOSIS — M79601 Pain in right arm: Secondary | ICD-10-CM

## 2024-03-27 DIAGNOSIS — M47817 Spondylosis without myelopathy or radiculopathy, lumbosacral region: Secondary | ICD-10-CM | POA: Insufficient documentation

## 2024-03-27 DIAGNOSIS — M47816 Spondylosis without myelopathy or radiculopathy, lumbar region: Secondary | ICD-10-CM

## 2024-03-27 DIAGNOSIS — Z6839 Body mass index (BMI) 39.0-39.9, adult: Secondary | ICD-10-CM | POA: Insufficient documentation

## 2024-03-27 DIAGNOSIS — M542 Cervicalgia: Secondary | ICD-10-CM

## 2024-03-27 DIAGNOSIS — M5136 Other intervertebral disc degeneration, lumbar region with discogenic back pain only: Secondary | ICD-10-CM

## 2024-03-27 DIAGNOSIS — E041 Nontoxic single thyroid nodule: Secondary | ICD-10-CM

## 2024-03-27 DIAGNOSIS — M549 Dorsalgia, unspecified: Secondary | ICD-10-CM

## 2024-03-27 DIAGNOSIS — S0990XA Unspecified injury of head, initial encounter: Secondary | ICD-10-CM

## 2024-03-27 DIAGNOSIS — S161XXA Strain of muscle, fascia and tendon at neck level, initial encounter: Secondary | ICD-10-CM | POA: Insufficient documentation

## 2024-03-27 DIAGNOSIS — E669 Obesity, unspecified: Secondary | ICD-10-CM | POA: Insufficient documentation

## 2024-03-27 DIAGNOSIS — M5030 Other cervical disc degeneration, unspecified cervical region: Secondary | ICD-10-CM | POA: Insufficient documentation

## 2024-03-27 DIAGNOSIS — S5011XA Contusion of right forearm, initial encounter: Secondary | ICD-10-CM | POA: Insufficient documentation

## 2024-03-27 MED ORDER — KETOROLAC 60 MG/2 ML INTRAMUSCULAR SOLUTION
INTRAMUSCULAR | Status: AC
Start: 2024-03-27 — End: 2024-03-27
  Filled 2024-03-27: qty 2

## 2024-03-27 MED ORDER — CYCLOBENZAPRINE 10 MG TABLET
10.0000 mg | ORAL_TABLET | Freq: Three times a day (TID) | ORAL | 0 refills | Status: AC | PRN
Start: 2024-03-27 — End: ?

## 2024-03-27 MED ORDER — CYCLOBENZAPRINE 10 MG TABLET
10.0000 mg | ORAL_TABLET | ORAL | Status: AC
Start: 2024-03-27 — End: 2024-03-27
  Administered 2024-03-27: 10 mg via ORAL

## 2024-03-27 MED ORDER — CYCLOBENZAPRINE 10 MG TABLET
ORAL_TABLET | ORAL | Status: AC
Start: 2024-03-27 — End: 2024-03-27
  Filled 2024-03-27: qty 1

## 2024-03-27 MED ORDER — KETOROLAC 60 MG/2 ML INTRAMUSCULAR SOLUTION
60.0000 mg | INTRAMUSCULAR | Status: AC
Start: 2024-03-27 — End: 2024-03-27
  Administered 2024-03-27: 60 mg via INTRAMUSCULAR

## 2024-03-27 MED ORDER — KETOROLAC 10 MG TABLET
10.0000 mg | ORAL_TABLET | Freq: Four times a day (QID) | ORAL | 0 refills | Status: AC | PRN
Start: 2024-03-27 — End: ?
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
## 2024-03-28 ENCOUNTER — Telehealth (INDEPENDENT_AMBULATORY_CARE_PROVIDER_SITE_OTHER): Payer: Self-pay | Admitting: Physician Assistant

## 2024-03-28 ENCOUNTER — Other Ambulatory Visit (INDEPENDENT_AMBULATORY_CARE_PROVIDER_SITE_OTHER): Payer: Self-pay | Admitting: Physician Assistant

## 2024-03-28 MED ORDER — PHENTERMINE 37.5 MG TABLET
37.5000 mg | ORAL_TABLET | Freq: Every morning | ORAL | 2 refills | Status: DC
Start: 2024-03-28 — End: 2024-05-17
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
## 2024-03-30 ENCOUNTER — Other Ambulatory Visit: Payer: Self-pay

## 2024-03-31 ENCOUNTER — Encounter (INDEPENDENT_AMBULATORY_CARE_PROVIDER_SITE_OTHER): Payer: Self-pay | Admitting: Physician Assistant

## 2024-03-31 ENCOUNTER — Telehealth (INDEPENDENT_AMBULATORY_CARE_PROVIDER_SITE_OTHER): Payer: Self-pay | Admitting: Physician Assistant

## 2024-03-31 ENCOUNTER — Ambulatory Visit: Admitting: Physician Assistant

## 2024-03-31 ENCOUNTER — Encounter (HOSPITAL_COMMUNITY): Payer: Self-pay

## 2024-03-31 VITALS — BP 138/82 | HR 82 | Ht 63.0 in | Wt 222.0 lb

## 2024-03-31 DIAGNOSIS — I10 Essential (primary) hypertension: Secondary | ICD-10-CM | POA: Insufficient documentation

## 2024-03-31 DIAGNOSIS — R7303 Prediabetes: Secondary | ICD-10-CM

## 2024-03-31 DIAGNOSIS — E041 Nontoxic single thyroid nodule: Secondary | ICD-10-CM | POA: Insufficient documentation

## 2024-03-31 DIAGNOSIS — Z09 Encounter for follow-up examination after completed treatment for conditions other than malignant neoplasm: Secondary | ICD-10-CM | POA: Insufficient documentation

## 2024-03-31 DIAGNOSIS — S60221D Contusion of right hand, subsequent encounter: Secondary | ICD-10-CM | POA: Insufficient documentation

## 2024-03-31 LAB — THYROID STIMULATING HORMONE WITH FREE T4 REFLEX: TSH: 2.37 u[IU]/mL (ref 0.450–5.330)

## 2024-03-31 MED ORDER — OLMESARTAN 40 MG TABLET
40.0000 mg | ORAL_TABLET | Freq: Every day | ORAL | 1 refills | Status: DC
Start: 2024-03-31 — End: 2024-04-26

## 2024-03-31 MED ORDER — SEMAGLUTIDE 0.25 MG OR 0.5 MG (2 MG/3 ML) SUBCUTANEOUS PEN INJECTOR
0.5000 mg | PEN_INJECTOR | SUBCUTANEOUS | 2 refills | Status: AC
Start: 2024-03-31 — End: ?

## 2024-03-31 NOTE — Nursing Note (Signed)
 Patient presents today for ED follow up.
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
## 2024-04-01 ENCOUNTER — Ambulatory Visit (INDEPENDENT_AMBULATORY_CARE_PROVIDER_SITE_OTHER): Payer: Self-pay

## 2024-04-01 DIAGNOSIS — E041 Nontoxic single thyroid nodule: Secondary | ICD-10-CM

## 2024-04-01 NOTE — Assessment & Plan Note (Addendum)
 A right lobe thyroid  nodule was identified on a CT cervical scan.  -obtain TSH and blood work  - Endocrinology referral for further evaluation.

## 2024-04-04 ENCOUNTER — Telehealth (INDEPENDENT_AMBULATORY_CARE_PROVIDER_SITE_OTHER): Payer: Self-pay | Admitting: Physician Assistant

## 2024-04-06 ENCOUNTER — Other Ambulatory Visit (INDEPENDENT_AMBULATORY_CARE_PROVIDER_SITE_OTHER): Payer: Self-pay

## 2024-04-06 DIAGNOSIS — E041 Nontoxic single thyroid nodule: Secondary | ICD-10-CM

## 2024-04-07 ENCOUNTER — Other Ambulatory Visit (INDEPENDENT_AMBULATORY_CARE_PROVIDER_SITE_OTHER): Payer: Self-pay | Admitting: Physician Assistant

## 2024-04-07 DIAGNOSIS — E041 Nontoxic single thyroid nodule: Secondary | ICD-10-CM

## 2024-04-07 DIAGNOSIS — R9389 Abnormal findings on diagnostic imaging of other specified body structures: Secondary | ICD-10-CM

## 2024-04-21 ENCOUNTER — Other Ambulatory Visit (INDEPENDENT_AMBULATORY_CARE_PROVIDER_SITE_OTHER): Payer: Self-pay | Admitting: Physician Assistant

## 2024-04-21 DIAGNOSIS — M79672 Pain in left foot: Secondary | ICD-10-CM

## 2024-04-21 DIAGNOSIS — M7989 Other specified soft tissue disorders: Secondary | ICD-10-CM

## 2024-04-21 DIAGNOSIS — R9389 Abnormal findings on diagnostic imaging of other specified body structures: Secondary | ICD-10-CM

## 2024-04-21 DIAGNOSIS — E041 Nontoxic single thyroid nodule: Secondary | ICD-10-CM

## 2024-04-26 ENCOUNTER — Other Ambulatory Visit (INDEPENDENT_AMBULATORY_CARE_PROVIDER_SITE_OTHER): Payer: Self-pay | Admitting: Physician Assistant

## 2024-04-26 MED ORDER — METFORMIN 500 MG TABLET
500.0000 mg | ORAL_TABLET | Freq: Every morning | ORAL | 1 refills | Status: AC
Start: 2024-04-26 — End: ?

## 2024-04-26 MED ORDER — OLMESARTAN 40 MG TABLET
40.0000 mg | ORAL_TABLET | Freq: Every day | ORAL | 1 refills | Status: AC
Start: 2024-04-26 — End: 2024-10-23

## 2024-04-26 MED ORDER — ARIPIPRAZOLE 2 MG TABLET
2.0000 mg | ORAL_TABLET | Freq: Every day | ORAL | 1 refills | Status: AC
Start: 2024-04-26 — End: ?

## 2024-04-26 MED ORDER — ROSUVASTATIN 5 MG TABLET
5.0000 mg | ORAL_TABLET | Freq: Every evening | ORAL | 1 refills | Status: AC
Start: 2024-04-26 — End: ?

## 2024-04-26 MED ORDER — ESCITALOPRAM 20 MG TABLET
20.0000 mg | ORAL_TABLET | Freq: Every day | ORAL | 1 refills | Status: AC
Start: 2024-04-26 — End: ?

## 2024-04-30 ENCOUNTER — Other Ambulatory Visit (INDEPENDENT_AMBULATORY_CARE_PROVIDER_SITE_OTHER): Payer: Self-pay | Admitting: Physician Assistant

## 2024-05-03 ENCOUNTER — Telehealth (INDEPENDENT_AMBULATORY_CARE_PROVIDER_SITE_OTHER): Payer: Self-pay | Admitting: Physician Assistant

## 2024-05-05 ENCOUNTER — Other Ambulatory Visit: Payer: Self-pay

## 2024-05-05 ENCOUNTER — Encounter (HOSPITAL_BASED_OUTPATIENT_CLINIC_OR_DEPARTMENT_OTHER): Payer: Self-pay

## 2024-05-05 ENCOUNTER — Other Ambulatory Visit (INDEPENDENT_AMBULATORY_CARE_PROVIDER_SITE_OTHER): Payer: Self-pay | Admitting: Physician Assistant

## 2024-05-05 ENCOUNTER — Emergency Department
Admission: EM | Admit: 2024-05-05 | Discharge: 2024-05-05 | Disposition: A | Discharge status: Home / Self Care | Attending: Emergency Medicine | Admitting: Emergency Medicine

## 2024-05-05 DIAGNOSIS — W458XXA Other foreign body or object entering through skin, initial encounter: Secondary | ICD-10-CM | POA: Insufficient documentation

## 2024-05-05 DIAGNOSIS — Z23 Encounter for immunization: Secondary | ICD-10-CM

## 2024-05-05 DIAGNOSIS — W260XXA Contact with knife, initial encounter: Secondary | ICD-10-CM

## 2024-05-05 DIAGNOSIS — S60417A Abrasion of left little finger, initial encounter: Secondary | ICD-10-CM

## 2024-05-05 DIAGNOSIS — Z6838 Body mass index (BMI) 38.0-38.9, adult: Secondary | ICD-10-CM | POA: Insufficient documentation

## 2024-05-05 DIAGNOSIS — E66812 Obesity, class 2: Secondary | ICD-10-CM | POA: Insufficient documentation

## 2024-05-05 DIAGNOSIS — Y93E9 Activity, other interior property and clothing maintenance: Secondary | ICD-10-CM

## 2024-05-05 MED ORDER — DIPHTH,PERTUSSIS(ACEL),TETANUS 2.5 LF UNIT-8 MCG-5 LF/0.5ML IM SYRINGE
0.5000 mL | INJECTION | INTRAMUSCULAR | Status: AC
Start: 2024-05-05 — End: 2024-05-05
  Administered 2024-05-05: 0.5 mL via INTRAMUSCULAR

## 2024-05-05 MED ORDER — DIPHTH,PERTUSSIS(ACEL),TETANUS 2.5 LF UNIT-8 MCG-5 LF/0.5ML IM SYRINGE
INJECTION | INTRAMUSCULAR | Status: AC
Start: 2024-05-05 — End: 2024-05-05
  Filled 2024-05-05: qty 0.5

## 2024-05-05 NOTE — Procedures (Signed)
 Lac Repair: L small finger    Date/Time: 05/05/2024 8:14 PM    Performed by: Bonna Cables, MD  Authorized by: Bonna Cables, MD    Consent:     Consent obtained:  Verbal    Consent given by:  Patient    Risks discussed:  Infection and pain    Alternatives discussed:  No treatment  Universal protocol:     Procedure explained and questions answered to patient or proxy's satisfaction: yes      Relevant documents present and verified: yes      Test results available: yes      Imaging studies available: yes      Required blood products, implants, devices, and special equipment available: yes      Site/side marked: yes      Immediately prior to procedure, a time out was called: yes      Patient identity confirmed:  Arm band  Anesthesia:     Anesthesia method:  None  Laceration details:     Location:  Finger    Finger location:  L small finger    Length (cm):  1  Pre-procedure details:     Preparation:  Patient was prepped and draped in usual sterile fashion  Exploration:     Limited defect created (wound extended): no      Hemostasis achieved with:  Direct pressure    Contaminated: no    Treatment:     Area cleansed with:  Chlorhexidine    Amount of cleaning:  Standard    Irrigation solution:  Sterile saline    Irrigation method:  Syringe    Visualized foreign bodies/material removed: no      Debridement:  None    Undermining:  None    Scar revision: no    Skin repair:     Repair method:  Tissue adhesive  Approximation:     Approximation:  Close  Repair type:     Repair type:  Simple  Post-procedure details:     Dressing:  Sterile dressing    Procedure completion:  Tolerated well, no immediate complications      Cables Bonna, MD   11/6/202520:14

## 2024-05-05 NOTE — Discharge Instructions (Signed)
 Keep area clean and covered for the next 48 hours

## 2024-05-05 NOTE — ED Nurses Note (Signed)
Patient discharged home all instructions gone over with patient with verbalized understanding. Patient ambulated off unit independently.
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
## 2024-05-05 NOTE — ED Nurses Note (Signed)
 Patients left pinky cleaned with Hibiclens and dermabond placed. Pt tolerated well

## 2024-05-06 ENCOUNTER — Ambulatory Visit (HOSPITAL_COMMUNITY): Payer: Self-pay

## 2024-05-09 ENCOUNTER — Ambulatory Visit (INDEPENDENT_AMBULATORY_CARE_PROVIDER_SITE_OTHER): Admitting: Physician Assistant

## 2024-05-09 ENCOUNTER — Telehealth (INDEPENDENT_AMBULATORY_CARE_PROVIDER_SITE_OTHER): Payer: Self-pay | Admitting: Physician Assistant

## 2024-05-09 NOTE — Progress Notes (Signed)
 [facility]  [address] [place], [place] [address]  [phone] Phone  [phone] Fax        Test Date:  05/09/2024    Patient: Stacy Cantrell DOB: 1981-03-29 Physician: Sherlean fleeta Hills, DO    Sex: Female Height: '  Ref Phys: Dewaine Dayhoff, DPM   ID#: [id number] Weight:  lbs. Technician: Sherlean fleeta Hills, DO      Limb(s) to Test  BLE, Lt tarsal tunnel concern     Reason for Exam  This is a 43 year old female presenting with left > right foot pain that worsens with standing/walking and extends to the posterior calf. She notes a knot near the calcaneus on the left. She has had these symptoms for >18 months, but has worsened over the last 8 months. Regarding the L > R feet numbness/tingling, the left foot is worse in digits 1-3 and the right foot is worse in digits 3-5. She reports leg/ankle weakness and stumbles often. She notes lower back pain that does not radiate down the legs.      Has a thyroid  disorder and will get a partial resection in the future per her report. Denies diabetes, chemotherapy/radiation, heavy EtOH use, liver/renal disease. Denies taking a blood thinner. Denies history of lower back or BLE surgeries.    Normal BLE exam except: pain in the right ankle limiting ankle DF strength testing.       Impression:  1. Abnormal study.   2. 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   3. No electrophysiologic evidence of a bilateral lumbosacral radiculopathy, bilateral lumbosacral plexopathy, bilateral tibial motor neuropathy proximal to the tarsal tunnel, bilateral fibular motor neuropathy at/around the fibular heads, or bilateral distal lower extremity large-fiber peripheral neuropathy.      NCV & EMG Findings:  Evaluation of the left lateral plantar mixed, the right lateral plantar mixed, the left medial plantar mixed, and the right medial plantar mixed  nerves showed prolonged distal peak latency (L5.7, R6.3, L5.2, R6.3 ms) and decreased velocities (Left lateral 28 m/s, Left medial 27 m/s, Right lateral 22 m/s, and R medial 22 m/s).  All remaining nerves (as indicated in the following tables) were within normal limits.      All examined muscles (as indicated in the following table) showed no evidence of electrical instability.            Patient verbal consent obtained.  Temperature controlled during study.  Two patient identifiers checked prior to starting the study.  Preliminary results discussed with patient.         Christian fleeta Hills, DO            Nerve Conduction Studies  Anti Sensory Summary Table     Stim Site Onset (ms) NR Peak (ms) Norm Peak (ms) P-T Amp (V) Norm P-T Amp Site1 Site2 Delta-P (ms) Dist (cm) Vel (m/s) Norm Vel (m/s)   Left Sup Fibular Anti Sensory (Ant Lat Mall)  28.1 C   14 cm 2.5    3.6 <4.4 31.8 >5.0 14 cm Ant Lat Mall 3.6 14.0 39 >32   Site 2 2.4    3.5  15.1          Site 3 3.2    3.8  11.5          Right Sup Fibular Anti Sensory (Ant Lat Mall)  30.2 C   14 cm 2.7  4.0 <4.4 10.9 >5.0 14 cm Ant Lat Mall 4.0 14.0 35 >32   Site 2 3.0    3.9  10.8          Left Sural Anti Sensory (Lat Mall)  28.1 C   Calf 2.0    3.4 <4.5 15.5 >4 Calf Lat Mall 3.4 14.0 41 >31   Site 2 2.2    3.5  10.8          Site 3 2.5    3.5  7.9          Right Sural Anti Sensory (Lat Mall)  30.2 C   Calf 2.6    3.4 <4.5 24.4 >4 Calf Lat Mall 3.4 14.0 41 >31   Site 2 2.7    3.5  20.4            Motor Summary Table     Stim Site NR Onset (ms) Norm Onset (ms) O-P Amp (mV) Norm O-P Amp Neg Area (mVms) Site1 Site2 Delta-0 (ms) Dist (cm) Vel (m/s) Norm Vel (m/s)   Left Fibular Motor (Ext Dig Brev)  28.1 C   Ankle    3.1 <6.5 5.7 >1.1 16.97 B Fib Ankle 6.5 31.5 48    B Fib    9.6  4.8  15.71 Poplt B Fib 1.2 8.5 71 >40   Poplt    10.8  4.9  15.79         Right Fibular Motor (Ext Dig Brev)  30.2 C   Ankle    3.4 <6.5 5.8 >1.1 15.51 B Fib Ankle 6.3 30.0 48    B Fib     9.7  5.4  14.49 Poplt B Fib 0.8 8.0 100 >40   Poplt    10.5  5.4  14.50         Left Lateral Plantar Motor (ADM)  27.7 C   Med Mall    4.1  4.1  10.74         Right Lateral Plantar Motor (ADM)  28.1 C   Med Mall    4.1  5.6  13.10         Left Medial Plantar Motor (AHB)  28.1 C    24 m/s   Med Mall    3.4  15.5  33.38         Right Medial Plantar Motor (AHB)  28.1 C    26 m/s    Med Mall    3.8  13.8  28.79         Left Tibial Motor (Abd Hall Brev)  28.1 C   Ankle    3.8 <6.1 13.6 >5.3 29.85 Knee Ankle 7.1 35.0 49    Knee    10.9  13.4  29.65         Right Tibial Motor (Abd Hall Brev)  30.2 C   Ankle    5.6 <6.1 19.8 >5.3 42.59 Knee Ankle 7.7 38.0 49    Knee    13.3  14.9  32.36           Mixed Summary Table     Stim Site NR Peak (ms) Norm Peak (ms) P-T Amp (V) Norm P-T Amp Site1 Site2 Delta-P (ms) Dist (cm) Vel (m/s) Norm Vel (m/s)   Left Lateral Plantar Mixed (Med Malleolus)  27.7 C    28 m/s   Lateral Foot    5.7 <3.7 14.0 >8     28 m/s     Right  Lateral Plantar Mixed (Med Malleolus)  28.1 C    22 m/s    Lateral Foot    6.3 <3.7 16.3 >8     22 m/s     Left Medial Plantar Mixed (Med Malleolus)  27.7 C    27 m/s    Medial Foot    5.2 <3.7 16.0 >10     27 m/s     Right Medial Plantar Mixed (Med Malleolus)  28.1 C    22 m/s    Medial Foot    6.3 <3.7 26.5 >10     22 m/s      EMG     Side Muscle Nerve Root Ins Act Fibs Psw Amp Dur Poly Recrt Int Bruna Comment   Right AntTibialis Dp Br Fibular L4-5 Nml Nml Nml Nml Nml 0 Nml Nml    Right Fibularis Long Sup Br Fibular L5-S1 Nml Nml Nml Nml Nml 0 Nml Nml    Right Gastroc Tibial S1-2 Nml Nml Nml Nml Nml 0 Nml Nml    Right Flex Dig Long Tibial L5-S2 Nml Nml Nml Nml Nml 0 Nml Nml    Right VastusMed Femoral L2-4 Nml Nml Nml Nml Nml 0 Nml Nml    Right Lumbo Parasp Low Rami L5-S1 Nml Nml Nml         Right Lumbo Parasp Mid Rami L3-4 Nml Nml Nml         Left Fibularis Long Sup Br Fibular L5-S1 Nml Nml Nml Nml Nml 0 Nml Nml    Left AntTibialis Dp Br Fibular L4-5 Nml Nml  Nml Nml Nml 0 Nml Nml    Left Gastroc Tibial S1-2 Nml Nml Nml Nml Nml 0 Nml Nml    Left Flex Dig Long Tibial L5-S2 Nml Nml Nml Nml Nml 0 Nml Nml    Left VastusMed Femoral L2-4 Nml Nml Nml Nml Nml 0 Nml Nml    Left Lumbo Parasp Low Rami L5-S1 Nml Nml Nml         Left Lumbo Parasp Mid Rami L3-4 Nml Nml Nml         Left AbdDigQuinti LatPlantar S1-2 Nml Nml Nml Nml Nml 0 Nml Nml    Left AbdHallucis MedPlantar S1-2 Nml Nml Nml Nml Nml 0 Nml Nml    Right AbdDigQuinti LatPlantar S1-2 Nml Nml Nml Nml Nml 0 Nml Nml    Right AbdHallucis MedPlantar S1-2 Nml Nml Nml Nml Nml 0 Nml Nml            Waveforms:

## 2024-05-09 NOTE — Telephone Encounter (Signed)
 Post Ed Follow-Up    Post ED Follow-Up:   Document completed and/or attempted interactive contact(s) after transition to home after emergency department stay.:   Transition Facility and relevant Date:   Discharge Date: 05/05/24  Discharge from [facility] Emergency Department?: Yes  Discharge Facility: [facility]  Contacted by: Ileana Rayann Jolley, CMA  Contact method: Patient/Caregiver Telephone  Contact completed: 05/09/2024 11:26 AM  Contact first attempt: 05/09/2024  9:15 AM  MyChart message sent?: No  Did the patient attempt to reach their PCP prior to going to the ED?: No  How is the patient recovering?: Completely recovered  Medications prescribed: No  Interventions: No needs identified  Follow Up Visit: Follow Up Appt Scheduled  Patient states that she was taking her trash out at her house and she cut her pinky finger on her left hand on a can. Patient stated that she thought she would possibly need a stitch in the finger, but the ER just put some liquid band aid on it, gave her a TDAP, and discharged her. Patient states that by the time she got home the liquid band aid had opened up and her finger started bleeding again. Patient has a follow up appointment with our office on 04/10/24 at 10am. Patient is aware of appointment.

## 2024-05-10 ENCOUNTER — Other Ambulatory Visit: Payer: Self-pay

## 2024-05-10 ENCOUNTER — Ambulatory Visit (INDEPENDENT_AMBULATORY_CARE_PROVIDER_SITE_OTHER): Payer: Self-pay | Admitting: OTOLARYNGOLOGY

## 2024-05-11 ENCOUNTER — Ambulatory Visit: Payer: Self-pay | Attending: Physician Assistant | Admitting: Physician Assistant

## 2024-05-11 ENCOUNTER — Other Ambulatory Visit (INDEPENDENT_AMBULATORY_CARE_PROVIDER_SITE_OTHER): Payer: Self-pay

## 2024-05-11 ENCOUNTER — Encounter (INDEPENDENT_AMBULATORY_CARE_PROVIDER_SITE_OTHER): Payer: Self-pay | Admitting: Physician Assistant

## 2024-05-11 VITALS — BP 122/68 | HR 86 | Wt 224.0 lb

## 2024-05-11 DIAGNOSIS — Z09 Encounter for follow-up examination after completed treatment for conditions other than malignant neoplasm: Secondary | ICD-10-CM | POA: Insufficient documentation

## 2024-05-11 DIAGNOSIS — S61217D Laceration without foreign body of left little finger without damage to nail, subsequent encounter: Secondary | ICD-10-CM | POA: Insufficient documentation

## 2024-05-11 NOTE — Progress Notes (Signed)
 INTERNAL MEDICINE, BUILDING A  [address]  [place] [address]  Operated by [facility]  Transitional Care Management Note    Name: Stacy Cantrell MRN:  [id number]   Date: 05/11/2024 Age: 43 y.o.     Chief Complaint: Hospital Discharge Transition     This note was created with assistance from Abridge via capture of conversational audio.  Consent was obtained from the patient prior to recording.      History of Present Illness  Stacy Cantrell is a 43 year old female being seen today in the office status post ER visit for a laceration on her 5th digit left hand.    Patient states the area overall looks better and there was no redness swelling drainage noted.  No new issues reported      ER evaluation noted:   [facility] emergency department           HISTORY OF PRESENT ILLNESS      Date:  05/05/2024  Patient's Name:  Stacy Cantrell  Date of Birth:  [birth year]     Patient is a 43 year old who while removing trash tonight about an hour prior to coming to the ER was cut on distal 5th finger left hand.  Bleeding presently controlled wound is superficial.  Last tetanus unknown.          Review of Systems      Review of Systems   Constitutional: Negative.    HENT: Negative.     Cardiovascular: Negative.    Gastrointestinal: Negative.    Skin:  Positive for wound.        Distal 5th finger abrasion   Neurological: Negative.    Psychiatric/Behavioral: Negative.     All other systems reviewed and are negative.       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      Allergic rhinitis      Anxiety      COVID-19 vaccine series declined      Depression      Gastroenteritis      Headache      Insomnia disorder                 Past Surgical History:        Past Surgical History:   Procedure Laterality Date    Hx carpal tunnel release Right      Hx  hysterectomy        Knee arthroscopy Left      Vascular surgery Right           Social History:  [Social History]    [Social History]        Tobacco Use    Smoking status: Former       Current packs/day: 0.00       Average packs/day: 0.5 packs/day for 15.7 years (7.9 ttl pk-yrs)       Types: Cigarettes       Start date: 10/06/1998       Quit date: 06/30/2014       Years since quitting: 9.8    Smokeless tobacco: Never   Vaping Use    Vaping status: Never Used   Substance Use Topics    Alcohol use: Yes       Alcohol/week: 4.0 standard drinks of alcohol  Types: 4 Glasses of wine per week    Drug use: Never     Social History          Substance and Sexual Activity   Drug Use Never         Family History:        Family History   Problem Relation Age of Onset    Elevated Lipids Father      Hypertension (High Blood Pressure) Father      Diabetes type II Maternal Grandmother      Hypertension (High Blood Pressure) Paternal Grandmother      Cervical Cancer Other      Breast Cancer Other                 Medication History:       Current Outpatient Medications   Medication Sig    ARIPiprazole  (ABILIFY ) 2 mg Oral Tablet Take 1 Tablet (2 mg total) by mouth Daily    cetirizine  (ZYRTEC ) 10 mg Oral Tablet TAKE 1 TABLET BY MOUTH EVERY DAY AS NEEDED    cyanocobalamin  (VITAMIN B12) 1,000 mcg/mL Injection Solution INJECT 1ML UNDER THE SKIN EVERY 30 DAYS    cyclobenzaprine  (FLEXERIL ) 10 mg Oral Tablet Take 1 Tablet (10 mg total) by mouth Three times a day as needed for Muscle spasms    escitalopram  oxalate (LEXAPRO ) 20 mg Oral Tablet Take 1 Tablet (20 mg total) by mouth Daily    Ibuprofen  (MOTRIN ) 800 mg Oral Tablet Take 1 Tablet (800 mg total) by mouth Three times a day as needed for Pain    ipratropium-albuterol  0.5 mg-3 mg(2.5 mg base)/3 mL Solution for Nebulization Take 3 mL by nebulization Every 4 hours as needed for Wheezing    ketorolac  tromethamine  (TORADOL ) 10 mg Oral Tablet Take 1 Tablet (10 mg total) by mouth Every 6 hours  as needed for Pain    meloxicam (MOBIC) 15 mg Oral Tablet TAKE 1 TABLET BY MOUTH EVERY DAY AS NEEDED FOR PAIN WITH FOOD    metFORMIN  (GLUCOPHAGE ) 500 mg Oral Tablet Take 1 Tablet (500 mg total) by mouth Every morning with breakfast    multivitamin-iron-folic acid  (CENTRUM) 18-400 mg-mcg Oral Tablet Take 1 Tablet by mouth Once a day    olmesartan  (BENICAR ) 40 mg Oral Tablet Take 1 Tablet (40 mg total) by mouth Daily    ondansetron  (ZOFRAN  ODT) 8 mg Oral Tablet, Rapid Dissolve Place 1 Tablet (8 mg total) under the tongue Every 8 hours as needed for Nausea/Vomiting    phentermine  (ADIPEX-P ) 37.5 mg Oral Tablet Take 1 Tablet (37.5 mg total) by mouth Every morning before breakfast    prochlorperazine  (COMPAZINE ) 10 mg Oral Tablet Take 1 Tablet (10 mg total) by mouth Every 6 hours as needed for Nausea/Vomiting    rosuvastatin  (CRESTOR ) 5 mg Oral Tablet Take 1 Tablet (5 mg total) by mouth Every evening    semaglutide  (OZEMPIC ) 0.25 mg or 0.5 mg (2 mg/3 mL) Subcutaneous Pen Injector Inject 0.5 mg under the skin Every 7 days    temazepam  (RESTORIL ) 7.5 mg Oral Capsule Take 1 Capsule (7.5 mg total) by mouth Every night as needed for Insomnia    topiramate  (TOPAMAX ) 50 mg Oral Tablet Take 1 Tablet (50 mg total) by mouth Once a day         Allergies:  [Allergies]    [Allergies]       Allergen Reactions    Codeine Hives/ Urticaria    Diphenhydramine Hcl Hives/ Urticaria  Penicillins Hives/ Urticaria    Diphenhydramine Itching        Physical Exam      Vitals:    BP (!) 133/93   Pulse 89   Temp 36.1 C (97 F)   Resp 18   Ht 1.6 m (5' 3)   Wt 98.9 kg (218 lb)   SpO2 99%   BMI 38.62 kg/m               Physical Exam  Vitals and nursing note reviewed. Exam conducted with a chaperone present.   Constitutional:       General: She is not in acute distress.     Appearance: She is well-developed. She is obese.   HENT:      Head: Normocephalic and atraumatic.      Nose: Nose normal.      Mouth/Throat:      Mouth: Mucous  membranes are moist.   Eyes:      Conjunctiva/sclera: Conjunctivae normal.      Pupils: Pupils are equal, round, and reactive to light.   Cardiovascular:      Rate and Rhythm: Normal rate and regular rhythm.      Heart sounds: No murmur heard.  Pulmonary:      Effort: Pulmonary effort is normal. No respiratory distress.      Breath sounds: Normal breath sounds.   Abdominal:      Palpations: Abdomen is soft.      Tenderness: There is no abdominal tenderness.   Musculoskeletal:         General: No swelling.      Right hand: Normal.      Left hand: Laceration present.      Cervical back: Neck supple.      Comments: Superficial avulsion/abrasion distal 5th finger no active bleeding   Skin:     General: Skin is warm and dry.      Capillary Refill: Capillary refill takes less than 2 seconds.   Neurological:      General: No focal deficit present.      Mental Status: She is alert and oriented to person, place, and time.   Psychiatric:         Mood and Affect: Mood normal.           Diagnostic Studies/Treatment      Medications:  Medications Ordered/Administered in the ED   diphtheria, pertussis-acell, tetanus (BOOSTRIX ) IM injection (0.5 mL IntraMUSCULAR Given 05/05/24 2005)             New Prescriptions     No medications on file         Labs:    No results found for this or any previous visit (from the past 12 hours).      Radiology:  None     No orders to display         ECG:  NONE                Differential diagnosis  1. Laceration 5th finger left hand, avulsion injury 5th finger left hand     Course/Disposition/Plan      Course:      Wound was cleaned Dermabond applied no indication for sutures.  Disposition:       Discharged     Condition at Disposition:      Stable  Follow up:   No follow-up provider specified.     Clinical Impression:     Clinical Impression   Abrasion of  left little finger (Primary)            Jesslyn Ponto, MD     Allergies[1]     ARIPiprazole  (ABILIFY ) 2 mg Oral Tablet, Take 1 Tablet (2 mg total)  by mouth Daily  cetirizine  (ZYRTEC ) 10 mg Oral Tablet, TAKE 1 TABLET BY MOUTH EVERY DAY AS NEEDED  cyanocobalamin  (VITAMIN B12) 1,000 mcg/mL Injection Solution, INJECT 1ML UNDER THE SKIN EVERY 30 DAYS  cyclobenzaprine  (FLEXERIL ) 10 mg Oral Tablet, Take 1 Tablet (10 mg total) by mouth Three times a day as needed for Muscle spasms  escitalopram  oxalate (LEXAPRO ) 20 mg Oral Tablet, Take 1 Tablet (20 mg total) by mouth Daily  ketorolac  tromethamine  (TORADOL ) 10 mg Oral Tablet, Take 1 Tablet (10 mg total) by mouth Every 6 hours as needed for Pain  metFORMIN  (GLUCOPHAGE ) 500 mg Oral Tablet, Take 1 Tablet (500 mg total) by mouth Every morning with breakfast  multivitamin-iron-folic acid  (CENTRUM) 18-400 mg-mcg Oral Tablet, Take 1 Tablet by mouth Once a day  olmesartan  (BENICAR ) 40 mg Oral Tablet, Take 1 Tablet (40 mg total) by mouth Daily  rosuvastatin  (CRESTOR ) 5 mg Oral Tablet, Take 1 Tablet (5 mg total) by mouth Every evening  semaglutide  (OZEMPIC ) 0.25 mg or 0.5 mg (2 mg/3 mL) Subcutaneous Pen Injector, Inject 0.5 mg under the skin Every 7 days  topiramate  (TOPAMAX ) 50 mg Oral Tablet, Take 1 Tablet (50 mg total) by mouth Once a day  Ibuprofen  (MOTRIN ) 800 mg Oral Tablet, Take 1 Tablet (800 mg total) by mouth Three times a day as needed for Pain  ipratropium-albuterol  0.5 mg-3 mg(2.5 mg base)/3 mL Solution for Nebulization, Take 3 mL by nebulization Every 4 hours as needed for Wheezing (Patient not taking: Reported on 05/11/2024)  meloxicam (MOBIC) 15 mg Oral Tablet, TAKE 1 TABLET BY MOUTH EVERY DAY AS NEEDED FOR PAIN WITH FOOD  ondansetron  (ZOFRAN  ODT) 8 mg Oral Tablet, Rapid Dissolve, Place 1 Tablet (8 mg total) under the tongue Every 8 hours as needed for Nausea/Vomiting  phentermine  (ADIPEX-P ) 37.5 mg Oral Tablet, Take 1 Tablet (37.5 mg total) by mouth Every morning before breakfast  prochlorperazine  (COMPAZINE ) 10 mg Oral Tablet, Take 1 Tablet (10 mg total) by mouth Every 6 hours as needed for  Nausea/Vomiting  temazepam  (RESTORIL ) 7.5 mg Oral Capsule, Take 1 Capsule (7.5 mg total) by mouth Every night as needed for Insomnia    No facility-administered medications prior to visit.     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      Social History     Socioeconomic History    Marital status: Married   Tobacco Use    Smoking status: Former     Current packs/day: 0.00     Average packs/day: 0.5 packs/day for 15.7 years (7.9 ttl pk-yrs)     Types: Cigarettes     Start date: 10/06/1998     Quit date: 06/30/2014     Years since quitting: 10.0    Smokeless tobacco: Never   Vaping Use    Vaping status: Never Used   Substance and Sexual Activity    Alcohol use: Yes  Alcohol/week: 4.0 standard drinks of alcohol     Types: 4 Glasses of wine per week    Drug use: Never    Sexual activity: Yes     Partners: Male      OBJECTIVE:   BP 122/68 (Site: Left Arm, Patient Position: Sitting, Cuff Size: Adult)   Pulse 86   Wt 102 kg (224 lb)   SpO2 95%   BMI 39.68 kg/m      Musculoskeletal:         General: No swelling.      Right hand: Normal.      Left hand: Laceration present.      Cervical back: Neck supple.      Comments: Superficial avulsion/abrasion distal 5th finger no active bleeding       Transition of Care Contact Information  Discharge date: Discharge Date: Not Found  Transition Facility Type  Facility Name Interactive Contact(s):  Clinical Staff Name/Role who contacted     Day, Ileana, MA   Medical Assistant     Telephone Encounter  Signed     Encounter Date: 05/09/2024       Post Ed Follow-Up    Post ED Follow-Up:   Document completed and/or attempted interactive contact(s) after transition to home after emergency department stay.:   Transition Facility and relevant Date:    Discharge Date: 05/05/24  Discharge from [facility] Emergency Department?: Yes  Discharge Facility: [facility]  Contacted by: Ileana Day, CMA  Contact method: Patient/Caregiver Telephone  Contact completed: 05/09/2024 11:26 AM  Contact first attempt: 05/09/2024  9:15 AM  MyChart message sent?: No  Did the patient attempt to reach their PCP prior to going to the ED?: No  How is the patient recovering?: Completely recovered  Medications prescribed: No  Interventions: No needs identified  Follow Up Visit: Follow Up Appt Scheduled  Patient states that she was taking her trash out at her house and she cut her pinky finger on her left hand on a can. Patient stated that she thought she would possibly need a stitch in the finger, but the ER just put some liquid band aid on it, gave her a TDAP, and discharged her. Patient states that by the time she got home the liquid band aid had opened up and her finger started bleeding again. Patient has a follow up appointment with our office on 04/10/24 at 10am. Patient is aware of appointment.              Electronically signed by Michae Ileana, MA at 05/09/24 1132    Data Reviewed  Medication Reconciliation completed    Assessment & Plan  Hospital discharge follow-up  Hosp notes reviewed in detail  Laceration of left little finger without foreign body without damage to nail, subsequent encounter  Laceration of pinky finger with area healing nicely.       Other transition actions (Optional) -: Discharge documentation was reviewed    Post-Discharge Follow Up Appointments       Monday Sep 12, 2024    Return Patient Visit with Scharlene No, PA-C at 10:00 AM      Wednesday Sep 14, 2024    New Patient Visit with Waverly Grate, PA-C at 10:30 AM      Tuesday Jun 20, 2025    Return Patient Visit with Scharlene No, PA-C at 10:00 AM      Internal Medicine, Building A  Building [address], [place]  [address]  [place] [address]  [phone] Urology,  [facility] [place], [place]  [address]  [place] [address]  [phone]               Greig Seats, PA-C         [1]   Allergies  Allergen Reactions    Codeine Hives/ Urticaria    Diphenhydramine Hcl Hives/ Urticaria    Penicillins Hives/ Urticaria    Diphenhydramine Itching

## 2024-05-11 NOTE — Nursing Note (Signed)
 Patient presents for an ED follow up

## 2024-05-12 ENCOUNTER — Emergency Department: Admission: EM | Admit: 2024-05-12 | Discharge: 2024-05-12 | Disposition: A | Discharge status: Home / Self Care

## 2024-05-12 ENCOUNTER — Emergency Department (HOSPITAL_BASED_OUTPATIENT_CLINIC_OR_DEPARTMENT_OTHER)

## 2024-05-12 ENCOUNTER — Other Ambulatory Visit: Payer: Self-pay

## 2024-05-12 ENCOUNTER — Encounter (HOSPITAL_BASED_OUTPATIENT_CLINIC_OR_DEPARTMENT_OTHER): Payer: Self-pay

## 2024-05-12 DIAGNOSIS — R1031 Right lower quadrant pain: Secondary | ICD-10-CM | POA: Insufficient documentation

## 2024-05-12 DIAGNOSIS — R110 Nausea: Secondary | ICD-10-CM | POA: Insufficient documentation

## 2024-05-12 DIAGNOSIS — R197 Diarrhea, unspecified: Secondary | ICD-10-CM | POA: Insufficient documentation

## 2024-05-12 DIAGNOSIS — K760 Fatty (change of) liver, not elsewhere classified: Secondary | ICD-10-CM | POA: Insufficient documentation

## 2024-05-12 DIAGNOSIS — C73 Malignant neoplasm of thyroid gland: Secondary | ICD-10-CM

## 2024-05-12 DIAGNOSIS — M79671 Pain in right foot: Secondary | ICD-10-CM | POA: Insufficient documentation

## 2024-05-12 DIAGNOSIS — I10 Essential (primary) hypertension: Secondary | ICD-10-CM | POA: Insufficient documentation

## 2024-05-12 DIAGNOSIS — Z87440 Personal history of urinary (tract) infections: Secondary | ICD-10-CM | POA: Insufficient documentation

## 2024-05-12 DIAGNOSIS — E876 Hypokalemia: Secondary | ICD-10-CM | POA: Insufficient documentation

## 2024-05-12 HISTORY — DX: Malignant neoplasm of thyroid gland: C73

## 2024-05-12 LAB — URINALYSIS, MACRO/MICRO
BILIRUBIN: NEGATIVE mg/dL
BLOOD: NEGATIVE mg/dL
GLUCOSE: NEGATIVE mg/dL
KETONES: NEGATIVE mg/dL
LEUKOCYTES: NEGATIVE WBCs/uL
NITRITE: NEGATIVE
PH: 7 (ref 4.6–8.0)
PROTEIN: NEGATIVE mg/dL
SPECIFIC GRAVITY: 1.01 (ref 1.003–1.035)
UROBILINOGEN: 0.2 mg/dL (ref 0.2–1.0)

## 2024-05-12 LAB — CBC WITH DIFF
BASOPHIL #: 0.03 x10ˆ3/uL (ref 0.00–0.10)
BASOPHIL %: 0 % (ref 0–1)
EOSINOPHIL #: 0.12 x10ˆ3/uL (ref 0.00–0.50)
EOSINOPHIL %: 1 % (ref 1–7)
HCT: 43.2 % — ABNORMAL HIGH (ref 31.2–41.9)
HGB: 14.7 g/dL — ABNORMAL HIGH (ref 10.9–14.3)
LYMPHOCYTE #: 1.4 x10ˆ3/uL (ref 1.10–3.10)
LYMPHOCYTE %: 16 % (ref 16–46)
MCH: 29.2 pg (ref 24.7–32.8)
MCHC: 34 g/dL (ref 32.3–35.6)
MCV: 85.7 fL (ref 75.5–95.3)
MONOCYTE #: 0.46 x10ˆ3/uL (ref 0.20–0.90)
MONOCYTE %: 5 % (ref 4–11)
MPV: 6.9 fL — ABNORMAL LOW (ref 7.9–10.8)
NEUTROPHIL #: 6.65 x10ˆ3/uL (ref 1.90–8.20)
NEUTROPHIL %: 77 % (ref 43–77)
PLATELETS: 239 x10ˆ3/uL (ref 140–440)
RBC: 5.04 x10ˆ6/uL — ABNORMAL HIGH (ref 3.63–4.92)
RDW: 15.3 % (ref 12.3–17.7)
WBC: 8.7 x10ˆ3/uL (ref 3.8–11.8)

## 2024-05-12 LAB — COMPREHENSIVE METABOLIC PANEL, NON-FASTING
ALBUMIN/GLOBULIN RATIO: 1 (ref 0.8–1.4)
ALBUMIN: 3.8 g/dL (ref 3.4–5.0)
ALKALINE PHOSPHATASE: 81 U/L (ref 46–116)
ALT (SGPT): 40 U/L (ref <=78)
ANION GAP: 10 mmol/L (ref 4–13)
AST (SGOT): 25 U/L (ref 15–37)
BILIRUBIN TOTAL: 0.4 mg/dL (ref 0.2–1.0)
BUN/CREA RATIO: 10
BUN: 10 mg/dL (ref 7–18)
CALCIUM, CORRECTED: 9.1 mg/dL
CALCIUM: 8.9 mg/dL (ref 8.5–10.1)
CHLORIDE: 102 mmol/L (ref 98–107)
CO2 TOTAL: 28 mmol/L (ref 21–32)
CREATININE: 1.01 mg/dL (ref 0.55–1.02)
ESTIMATED GFR: 71 mL/min/1.73mˆ2 (ref >59)
GLOBULIN: 3.9
GLUCOSE: 102 mg/dL (ref 74–106)
OSMOLALITY, CALCULATED: 279 mosm/kg (ref 270–290)
POTASSIUM: 3.2 mmol/L — ABNORMAL LOW (ref 3.5–5.1)
PROTEIN TOTAL: 7.7 g/dL (ref 6.4–8.2)
SODIUM: 140 mmol/L (ref 136–145)

## 2024-05-12 LAB — C. DIFFICILE PCR
C. DIFFICILE TOXIN GENE, PCR: NEGATIVE
PRESUMPTIVE 027/NAP1/BI: NEGATIVE

## 2024-05-12 LAB — LIPASE: LIPASE: 51 U/L (ref 15–77)

## 2024-05-12 LAB — LACTIC ACID LEVEL W/ REFLEX FOR LEVEL >2.0: LACTIC ACID: 1.3 mmol/L (ref 0.4–2.0)

## 2024-05-12 MED ORDER — POTASSIUM BICARBONATE-CITRIC ACID 25 MEQ EFFERVESCENT TABLET
50.0000 meq | EFFERVESCENT_TABLET | ORAL | Status: AC
Start: 2024-05-12 — End: 2024-05-12
  Administered 2024-05-12: 50 meq via ORAL

## 2024-05-12 MED ORDER — KETOROLAC 30 MG/ML (1 ML) INJECTION SOLUTION
30.0000 mg | INTRAMUSCULAR | Status: AC
Start: 2024-05-12 — End: 2024-05-12
  Administered 2024-05-12: 30 mg via INTRAVENOUS

## 2024-05-12 MED ORDER — IOPAMIDOL 370 MG IODINE/ML (76 %) INTRAVENOUS SOLUTION
100.0000 mL | INTRAVENOUS | Status: AC
Start: 2024-05-12 — End: 2024-05-12
  Administered 2024-05-12: 75 mL via INTRAVENOUS
  Filled 2024-05-12: qty 100

## 2024-05-12 MED ORDER — POTASSIUM BICARBONATE-CITRIC ACID 25 MEQ EFFERVESCENT TABLET
EFFERVESCENT_TABLET | ORAL | Status: AC
Start: 2024-05-12 — End: 2024-05-12
  Filled 2024-05-12: qty 2

## 2024-05-12 MED ORDER — SODIUM CHLORIDE 0.9 % IV BOLUS
1000.0000 mL | INJECTION | Status: AC
Start: 2024-05-12 — End: 2024-05-12
  Administered 2024-05-12: 0 mL via INTRAVENOUS
  Administered 2024-05-12: 1000 mL via INTRAVENOUS

## 2024-05-12 MED ORDER — KETOROLAC 30 MG/ML (1 ML) INJECTION SOLUTION
INTRAMUSCULAR | Status: AC
Start: 2024-05-12 — End: 2024-05-12
  Filled 2024-05-12: qty 1

## 2024-05-12 NOTE — ED Nurses Note (Signed)
 Patient discharged home with family.  AVS reviewed with patient/care giver.  A written copy of the AVS and discharge instructions was given to the patient/care giver. Scripts handed to patient/care giver. Questions sufficiently answered as needed.  Patient/care giver encouraged to follow up with PCP as indicated.  In the event of an emergency, patient/care giver instructed to call 911 or go to the nearest emergency room.

## 2024-05-12 NOTE — ED Nurses Note (Signed)
 Patient states that the pain medication has eased her pain. Patient has voiced no new concerns or complaints. Plan of care ongoing.

## 2024-05-12 NOTE — Discharge Instructions (Addendum)
 Thank you for allowing us  to be part of your care.    Please call the hospital medical records office for a copy of your finalized results, and review them with a primary care physician, for any findings needing further attention.    If you feel your situation worsens, or does not get better in 48 hours, please see a physician for evaluation.    We encourage you to see your regular doctor as soon as possible to let them know you were seen in the emergency department. They may want to do further testing. If you do not have a doctor, please feel free to call the hospital, and ask for contact information of accepting providers. Please also discuss your vaccinations, and ensure all are up to date.

## 2024-05-12 NOTE — ED Provider Notes (Signed)
 [facility], [place] - Emergency Department  ED Primary Note  History of Present Illness  Stacy Cantrell is a 43 year old female with right flank and lower right quadrant pain.    Pain symptoms  - Significant pain originating from the right side of the flank and radiating to the right lower quadrant  - Onset last night  - Initial pain severity 3/10, escalating to 7/10 approximately 1.5 hours prior to presentation    Associated constitutional and gastrointestinal symptoms  - Chills and sweating last night  - Nausea without vomiting  - Decreased oral intake today  - Change in bowel habits with runny stools  - Currently feels hot and warm, but unable to recall exact temperature    Urinary and neurological symptoms  - No current urinary symptoms    Relevant medical and surgical history  - History of hypertension, currently takes antihypertensive medication daily  - Confirmed taking blood pressure medication this morning  - History of knee arthritis surgery  - History of carpal tunnel surgery  - No history of kidney stones  - Father with history of kidney stones  - History of bladder infection approximately seven years ago  Physical Exam   ED Triage Vitals [05/12/24 1836]   BP (Non-Invasive) (!) 152/107   Heart Rate (!) 116   Respiratory Rate 18   Temperature 37.4 C (99.4 F)   SpO2 96 %   Weight 99.9 kg (220 lb 3.2 oz)   Height 1.65 m (5' 4.96)     Physical Exam  GENERAL: Alert, cooperative, well developed, no acute distress  HEENT: Normocephalic, normal oropharynx, moist mucous membranes  CHEST: Clear to auscultation bilaterally, No wheezes, rhonchi, or crackles  CARDIOVASCULAR: Normal heart rate and rhythm, S1 and S2 normal without murmurs  ABDOMEN: Soft, RUQ tender, non-distended, without organomegaly, Normal bowel sounds  EXTREMITIES: No cyanosis or edema  NEUROLOGICAL: Cranial nerves grossly intact, Moves all extremities without gross motor or sensory deficit  Patient Data    Results  LABS  Potassium: 3.2 (05/12/2024)  Urinalysis: Unremarkable (05/12/2024)    RADIOLOGY  CT abdomen pelvis: No acute inflammatory findings. Mild prominence of the right renal collecting system in proximal right ureter with gradual tapering. No evidence of a urinary tract calculus. Hepatic steatosis noted. Otherwise unremarkable. (05/12/2024)    Labs Ordered/Reviewed   COMPREHENSIVE METABOLIC PANEL, NON-FASTING - Abnormal; Notable for the following components:       Result Value    POTASSIUM 3.2 (*)     All other components within normal limits    Narrative:     Estimated Glomerular Filtration Rate (eGFR) is calculated using the CKD-EPI (2021) equation, intended for patients 41 years of age and older. If gender is not documented or unknown, there will be no eGFR calculation.   CBC WITH DIFF - Abnormal; Notable for the following components:    RBC 5.04 (*)     HGB 14.7 (*)     HCT 43.2 (*)     MPV 6.9 (*)     All other components within normal limits   LIPASE - Normal   URINALYSIS, MACRO/MICRO - Normal   LACTIC ACID LEVEL W/ REFLEX FOR LEVEL >2.0 - Normal   ADULT ROUTINE BLOOD CULTURE, SET OF 2 BOTTLES (BACTERIA AND YEAST)   ADULT ROUTINE BLOOD CULTURE, SET OF 2 BOTTLES (BACTERIA AND YEAST)   GI PANEL BY BIOFIRE FILM ARRAY   C. DIFFICILE PCR   CBC/DIFF    Narrative:  The following orders were created for panel order CBC/DIFF.  Procedure                               Abnormality         Status                     ---------                               -----------         ------                     CBC WITH IPQQ[[id number]]                Abnormal            Final result                 Please view results for these tests on the individual orders.   URINALYSIS WITH REFLEX MICROSCOPIC AND CULTURE IF POSITIVE    Narrative:     The following orders were created for panel order URINALYSIS WITH REFLEX MICROSCOPIC AND CULTURE IF POSITIVE.  Procedure                               Abnormality         Status                      ---------                               -----------         ------                     URINALYSIS, MACRO/MICRO[[id number]]      Normal              Final result                 Please view results for these tests on the individual orders.     CT ABDOMEN PELVIS W IV CONTRAST   Final Result by Edi, Radresults In (11/13 2121)      No acute inflammatory findings in the abdomen or pelvis.      Mild prominence of the right renal collecting system and proximal right ureter with gradual tapering. No evidence of urinary tract calculi.      Hepatic steatosis.                     Radiologist location ID: [id number]           Medical Decision Making          Medical Decision Making  43 year old female with a history of hypertension, status post hysterectomy, presented with acute right flank pain radiating into the right lower quadrant, fever with chills and sweating, nausea, and diarrhea since last night. She reported increased pain severity, subjective fever, and minimal oral intake. No history of similar episodes, kidney stones, or recent urinary tract infections. No recent medication use except for antihypertensives and ibuprofen .    Differential diagnosis includes, but is not limited to:  - Renal Colic: Considered due to pain radiating up  her leg and family history, but absence of urinary symptoms and no personal history of kidney stones make this less likely.  - Appendicitis or Other Intra-abdominal Pathology: Considered due to acute right foot pain with fever and gastrointestinal symptoms; further evaluation is needed to distinguish among intra-abdominal causes.    Acute right foot pain radiating up leg, fever, nausea, and diarrhea  - Administered ibuprofen  for pain management  - Ordered laboratory tests to investigate underlying causes  Medical Decision Making  Amount and/or Complexity of Data Reviewed  Independent Historian: friend  Labs: ordered. Decision-making details documented in ED  Course.  Radiology: ordered. Decision-making details documented in ED Course.  ECG/medicine tests: independent interpretation performed. Decision-making details documented in ED Course.    Risk  Prescription drug management.      ED Course as of 05/12/24 2215   Thu May 12, 2024   1910 CT made aware to change the CT scan to with oral contrast.   1917 CBC/DIFF(!)  H&H 14.7 and 43.2   1935 URINALYSIS WITH REFLEX MICROSCOPIC AND CULTURE IF POSITIVE  Unremarkable   1944 LACTIC ACID: 1.3   2013 COMPREHENSIVE METABOLIC PANEL, NON-FASTING(!)  Potassium 3.2 otherwise unremarkable   2014 LIPASE: 51   2124 CT ABDOMEN PELVIS W IV CONTRAST  No acute inflammatory findings in the abdomen or pelvis.     Mild prominence of the right renal collecting system and proximal right ureter with gradual tapering. No evidence of urinary tract calculi.     Hepatic steatosis.        Medications Ordered/Administered in the ED   NS bolus infusion 1,000 mL (0 mL Intravenous Stopped 05/12/24 1957)   ketorolac  (TORADOL ) 30 mg/mL injection (30 mg Intravenous Given 05/12/24 1857)   iopamidol  (ISOVUE -370) 76% solution (75 mL Intravenous Given 05/12/24 2111)   potassium bicarbonate -citric acid  (EFFER-K ) effervescent tablet (50 mEq Oral Given 05/12/24 2142)     Clinical Impression   Right lower quadrant abdominal pain (Primary)   Diarrhea, unspecified type   Nausea   Hypokalemia       Disposition: Discharged         This note was created with assistance from [employer]bridge via capture of conversational audio.  Consent was obtained from the patient prior to recording.

## 2024-05-13 ENCOUNTER — Telehealth (INDEPENDENT_AMBULATORY_CARE_PROVIDER_SITE_OTHER): Payer: Self-pay | Admitting: Physician Assistant

## 2024-05-14 LAB — GI PANEL BY BIOFIRE FILM ARRAY
ADENOVIRUS F 40/41: NOT DETECTED
ASTROVIRUS: NOT DETECTED
CAMPYLOBACTER: DETECTED — AB
CRYPTOSPORIDIUM: NOT DETECTED
CYCLOSPORA CAYETANENSIS: NOT DETECTED
ENTAMOEBA HISTOLYTICA: NOT DETECTED
ENTEROAGGREGATIVE E. COLI (EAEC): NOT DETECTED
ENTEROPATHOGENIC E COLI (EPEC): NOT DETECTED
ENTEROTOXIGENIC E COLI (ETEC) LT/ST: NOT DETECTED
GIARDIA LAMBLIA: NOT DETECTED
NOROVIRUS GI/GII: NOT DETECTED
PLESIOMONAS SHIGELLOIDES: NOT DETECTED
ROTAVIRUS A: NOT DETECTED
SALMONELLA SPECIES: NOT DETECTED
SAPOVIRUS: NOT DETECTED
SHIGA-LIKE TOXIN-PRODUCING E COLI (STEC) STX1/STX2: NOT DETECTED
SHIGELLA/ENTEROINVASIVE E COLI (EIEC): NOT DETECTED
VIBRIO CHOLERAE: NOT DETECTED
VIBRIO: NOT DETECTED
YERSINIA ENTEROCOLITICA: NOT DETECTED

## 2024-05-15 ENCOUNTER — Emergency Department: Admission: EM | Admit: 2024-05-15 | Discharge: 2024-05-15 | Disposition: A | Discharge status: Home / Self Care

## 2024-05-15 ENCOUNTER — Other Ambulatory Visit: Payer: Self-pay

## 2024-05-15 ENCOUNTER — Emergency Department (HOSPITAL_COMMUNITY)

## 2024-05-15 ENCOUNTER — Encounter (HOSPITAL_COMMUNITY): Payer: Self-pay

## 2024-05-15 DIAGNOSIS — K529 Noninfective gastroenteritis and colitis, unspecified: Secondary | ICD-10-CM | POA: Insufficient documentation

## 2024-05-15 DIAGNOSIS — R109 Unspecified abdominal pain: Secondary | ICD-10-CM

## 2024-05-15 DIAGNOSIS — R10813 Right lower quadrant abdominal tenderness: Secondary | ICD-10-CM

## 2024-05-15 DIAGNOSIS — Z9049 Acquired absence of other specified parts of digestive tract: Secondary | ICD-10-CM | POA: Insufficient documentation

## 2024-05-15 DIAGNOSIS — A045 Campylobacter enteritis: Secondary | ICD-10-CM | POA: Insufficient documentation

## 2024-05-15 DIAGNOSIS — Z90710 Acquired absence of both cervix and uterus: Secondary | ICD-10-CM | POA: Insufficient documentation

## 2024-05-15 LAB — URINALYSIS, MICROSCOPIC
RBCS: 2 /HPF (ref <4)
SQUAMOUS EPITHELIAL: 13 /HPF (ref <28)
WBCS: <1 /HPF (ref <6)

## 2024-05-15 LAB — COMPREHENSIVE METABOLIC PANEL, NON-FASTING
ALBUMIN/GLOBULIN RATIO: 1.3 (ref 0.8–1.4)
ALBUMIN: 4.4 g/dL (ref 3.5–5.7)
ALKALINE PHOSPHATASE: 56 U/L (ref 34–104)
ALT (SGPT): 31 U/L (ref 7–52)
ANION GAP: 11 mmol/L (ref 4–13)
AST (SGOT): 24 U/L (ref 13–39)
BILIRUBIN TOTAL: 0.5 mg/dL (ref 0.3–1.0)
BUN/CREA RATIO: 9 (ref 6–22)
BUN: 7 mg/dL (ref 7–25)
CALCIUM, CORRECTED: 9.2 mg/dL (ref 8.9–10.8)
CALCIUM: 9.5 mg/dL (ref 8.6–10.3)
CHLORIDE: 103 mmol/L (ref 98–107)
CO2 TOTAL: 24 mmol/L (ref 21–31)
CREATININE: 0.77 mg/dL (ref 0.60–1.30)
ESTIMATED GFR: 99 mL/min/1.73mˆ2 (ref >59)
GLOBULIN: 3.5 (ref 2.0–3.5)
GLUCOSE: 99 mg/dL (ref 74–109)
OSMOLALITY, CALCULATED: 274 mosm/kg (ref 270–290)
POTASSIUM: 3.5 mmol/L (ref 3.5–5.1)
PROTEIN TOTAL: 7.9 g/dL (ref 6.4–8.9)
SODIUM: 138 mmol/L (ref 136–145)

## 2024-05-15 LAB — CBC WITH DIFF
BASOPHIL #: 0 x10ˆ3/uL (ref 0.00–0.10)
BASOPHIL %: 1 % (ref 0–1)
EOSINOPHIL #: 0.3 x10ˆ3/uL (ref 0.00–0.50)
EOSINOPHIL %: 3 % (ref 1–7)
HCT: 40.8 % (ref 31.2–41.9)
HGB: 14.5 g/dL — ABNORMAL HIGH (ref 10.9–14.3)
LYMPHOCYTE #: 1.8 x10ˆ3/uL (ref 1.10–3.10)
LYMPHOCYTE %: 21 % (ref 16–46)
MCH: 29.4 pg (ref 24.7–32.8)
MCHC: 35.6 g/dL (ref 32.3–35.6)
MCV: 82.6 fL (ref 75.5–95.3)
MONOCYTE #: 0.8 x10ˆ3/uL (ref 0.20–0.90)
MONOCYTE %: 9 % (ref 4–11)
MPV: 6.8 fL — ABNORMAL LOW (ref 7.9–10.8)
NEUTROPHIL #: 5.7 x10ˆ3/uL (ref 1.90–8.20)
NEUTROPHIL %: 66 % (ref 43–77)
PLATELETS: 301 x10ˆ3/uL (ref 140–440)
RBC: 4.94 x10ˆ6/uL — ABNORMAL HIGH (ref 3.63–4.92)
RDW: 12.9 % (ref 12.3–17.7)
WBC: 8.6 x10ˆ3/uL (ref 3.8–11.8)

## 2024-05-15 LAB — URINALYSIS, MACROSCOPIC
BILIRUBIN: NEGATIVE mg/dL
BLOOD: NEGATIVE mg/dL
GLUCOSE: NEGATIVE mg/dL
KETONES: NEGATIVE mg/dL
LEUKOCYTES: NEGATIVE WBCs/uL
NITRITE: NEGATIVE
PH: 6 (ref 5.0–9.0)
PROTEIN: 30 mg/dL — AB
SPECIFIC GRAVITY: 1.01 (ref 1.002–1.030)
UROBILINOGEN: NORMAL mg/dL

## 2024-05-15 LAB — LACTIC ACID - FIRST REFLEX: LACTIC ACID: 0.7 mmol/L (ref 0.5–2.2)

## 2024-05-15 LAB — LIPASE: LIPASE: 50 U/L (ref 11–82)

## 2024-05-15 LAB — LACTIC ACID LEVEL W/ REFLEX FOR LEVEL >2.0: LACTIC ACID: 2.9 mmol/L — ABNORMAL HIGH (ref 0.5–2.2)

## 2024-05-15 MED ORDER — ONDANSETRON 4 MG DISINTEGRATING TABLET: 4.0000 mg | ORAL_TABLET | Freq: Three times a day (TID) | ORAL | 0 refills | Status: DC | PRN

## 2024-05-15 MED ORDER — KETOROLAC 30 MG/ML (1 ML) INJECTION SOLUTION
30.0000 mg | INTRAMUSCULAR | Status: AC
Start: 2024-05-15 — End: 2024-05-15
  Administered 2024-05-15: 30 mg via INTRAVENOUS

## 2024-05-15 MED ORDER — AZITHROMYCIN 250 MG TABLET
1000.0000 mg | ORAL_TABLET | ORAL | Status: AC
Start: 2024-05-15 — End: 2024-05-15
  Administered 2024-05-15: 1000 mg via ORAL

## 2024-05-15 MED ORDER — ONDANSETRON HCL (PF) 4 MG/2 ML INJECTION SOLUTION
4.0000 mg | INTRAMUSCULAR | Status: AC
Start: 2024-05-15 — End: 2024-05-15
  Administered 2024-05-15: 4 mg via INTRAVENOUS

## 2024-05-15 MED ORDER — KETOROLAC 30 MG/ML (1 ML) INJECTION SOLUTION
INTRAMUSCULAR | Status: AC
Start: 2024-05-15 — End: 2024-05-15
  Filled 2024-05-15: qty 1

## 2024-05-15 MED ORDER — SODIUM CHLORIDE 0.9 % IV BOLUS
1000.0000 mL | INJECTION | Status: AC
Start: 2024-05-15 — End: 2024-05-15
  Administered 2024-05-15: 1000 mL via INTRAVENOUS
  Administered 2024-05-15: 0 mL via INTRAVENOUS

## 2024-05-15 MED ORDER — IOPAMIDOL 300 MG IODINE/ML (61 %) INTRAVENOUS SOLUTION
100.0000 mL | INTRAVENOUS | Status: AC
Start: 2024-05-15 — End: 2024-05-15
  Administered 2024-05-15: 100 mL via INTRAVENOUS

## 2024-05-15 MED ORDER — AZITHROMYCIN 250 MG TABLET
ORAL_TABLET | ORAL | Status: AC
Start: 2024-05-15 — End: 2024-05-15
  Filled 2024-05-15: qty 4

## 2024-05-15 MED ORDER — ONDANSETRON HCL (PF) 4 MG/2 ML INJECTION SOLUTION
INTRAMUSCULAR | Status: AC
Start: 2024-05-15 — End: 2024-05-15
  Filled 2024-05-15: qty 2

## 2024-05-15 MED ORDER — ONDANSETRON 4 MG DISINTEGRATING TABLET
ORAL_TABLET | ORAL | Status: AC
Start: 2024-05-15 — End: 2024-05-15
  Filled 2024-05-15: qty 1

## 2024-05-15 NOTE — ED APP Handoff Note (Signed)
 [facility] - Emergency Department  Emergency Department  Provider in Triage Note    Name: Stacy Cantrell  Age: 43 y.o.  Gender: female     Subjective:  This 43 year old female presents to the emergency department complaining of right lower quadrant abdominal pain that radiates into the right flank and right back that started 4 days ago.  She reports that she went to another emergency department has a CT scan which showed no acute abnormality.  She complains of nausea and vomiting and states that she can not keep anything down.  She reports intermittent fever.  She also reports having diarrhea.  She denies dysuria.  Stacy Cantrell is a 43 y.o. female who presents with complaint of Abdominal Pain  .      Objective:  Alert and oriented female in no acute distress.  Vital signs are stable.  Filed Vitals:    05/15/24 1359   BP: (!) 128/96   Pulse: 98   Resp: 18   Temp: 36.6 C (97.9 F)   SpO2: 100%      Focused Physical Exam shows right lower quadrant tenderness.    Assessment:  A medical screening exam was completed.  This patient is a 43 y.o. female with initial findings showing an alert and oriented female in no acute distress.  Airway is patent.  Breath sounds are clear and equal bilaterally.  Skin is warm and dry.  Abdomen is soft with tenderness to the right lower quadrant.  Moves all extremities.    Plan:  Please see initial orders and work-up below.  This is to be continued with full evaluation in the main Emergency Department.     ketorolac  (TORADOL ) 30 mg/mL injection, 30 mg, Intravenous, Now  NS bolus infusion 1,000 mL, 1,000 mL, Intravenous, Now  ondansetron  (ZOFRAN ) 2 mg/mL injection, 4 mg, Intravenous, Now       Results for orders placed or performed during the hospital encounter of 05/15/24 (from the past 24 hours)   CBC/DIFF    Collection Time: 05/15/24  2:02 PM    Narrative    The following orders were created for panel order CBC/DIFF.  Procedure                                Abnormality         Status                     ---------                               -----------         ------                     CBC WITH IPQQ[[id number]]                                                                 Please view results for these tests on the individual orders.   URINALYSIS, MACROSCOPIC AND MICROSCOPIC W/CULTURE REFLEX    Collection Time: 05/15/24  2:02 PM    Specimen: Urine, Site not specified  Narrative    The following orders were created for panel order URINALYSIS, MACROSCOPIC AND MICROSCOPIC W/CULTURE REFLEX.  Procedure                               Abnormality         Status                     ---------                               -----------         ------                     URINALYSIS, MACROSCOPIC[[id number]]                                                     URINALYSIS, MICROSCOPIC[[id number]]                                                       Please view results for these tests on the individual orders.        Harman LELON Blazer, APRN, CNP  05/15/2024, 13:57

## 2024-05-16 ENCOUNTER — Telehealth (INDEPENDENT_AMBULATORY_CARE_PROVIDER_SITE_OTHER): Payer: Self-pay | Admitting: Physician Assistant

## 2024-05-16 ENCOUNTER — Ambulatory Visit (INDEPENDENT_AMBULATORY_CARE_PROVIDER_SITE_OTHER): Payer: Self-pay

## 2024-05-16 MED ORDER — AZITHROMYCIN 500 MG TABLET: 500.0000 mg | ORAL_TABLET | Freq: Every day | ORAL | 0 refills | Status: DC

## 2024-05-16 NOTE — Telephone Encounter (Signed)
 Post Ed Follow-Up    Post ED Follow-Up:   Document completed and/or attempted interactive contact(s) after transition to home after emergency department stay.:   Transition Facility and relevant Date:   Discharge Date: 05/15/24  Discharge from [facility] Emergency Department?: Yes  Discharge Facility: [facility]  Contacted by: Ileana Michae LATHER  Contact method: Patient/Caregiver Telephone  Contact completed: 05/16/2024  4:36 PM  MyChart message sent?: No  Was the AVS reviewed with patient?: Yes  Did the patient attempt to reach their PCP prior to going to the ED?: No  How is the patient recovering?: No better or worse  Medications prescribed: Yes  Were they obtained?: Yes  Interventions: No needs identified  Follow Up Visit: Follow Up Appt Scheduled  Patient states that she went to the ER with abdominal pain in the right side around to her right kidney. Patient states that the pain is still not any better and she is just not feeling the best. Follow up appointment was made with the office for 05/18/24.

## 2024-05-16 NOTE — Telephone Encounter (Signed)
 Transition completed.

## 2024-05-17 ENCOUNTER — Other Ambulatory Visit: Payer: Self-pay

## 2024-05-17 ENCOUNTER — Other Ambulatory Visit (INDEPENDENT_AMBULATORY_CARE_PROVIDER_SITE_OTHER): Payer: Self-pay | Admitting: Physician Assistant

## 2024-05-17 LAB — ADULT ROUTINE BLOOD CULTURE, SET OF 2 BOTTLES (BACTERIA AND YEAST)
BLOOD CULTURE, ROUTINE: NO GROWTH
BLOOD CULTURE, ROUTINE: NO GROWTH

## 2024-05-17 MED ORDER — PHENTERMINE 37.5 MG TABLET: 37.5000 mg | ORAL_TABLET | Freq: Every morning | ORAL | 2 refills | Status: DC

## 2024-05-18 ENCOUNTER — Encounter (INDEPENDENT_AMBULATORY_CARE_PROVIDER_SITE_OTHER): Payer: Self-pay | Admitting: Physician Assistant

## 2024-05-18 ENCOUNTER — Ambulatory Visit: Payer: Self-pay | Attending: Physician Assistant | Admitting: Physician Assistant

## 2024-05-18 VITALS — BP 122/82 | HR 90 | Ht 64.0 in | Wt 225.0 lb

## 2024-05-18 DIAGNOSIS — N281 Cyst of kidney, acquired: Secondary | ICD-10-CM | POA: Insufficient documentation

## 2024-05-18 DIAGNOSIS — A045 Campylobacter enteritis: Secondary | ICD-10-CM | POA: Insufficient documentation

## 2024-05-18 DIAGNOSIS — Z09 Encounter for follow-up examination after completed treatment for conditions other than malignant neoplasm: Secondary | ICD-10-CM | POA: Insufficient documentation

## 2024-05-18 DIAGNOSIS — I10 Essential (primary) hypertension: Secondary | ICD-10-CM | POA: Insufficient documentation

## 2024-05-18 DIAGNOSIS — R109 Unspecified abdominal pain: Secondary | ICD-10-CM | POA: Insufficient documentation

## 2024-05-18 NOTE — Nursing Note (Signed)
 Patient presents today as a ED follow up.

## 2024-05-18 NOTE — Progress Notes (Signed)
 INTERNAL MEDICINE, BUILDING A  [address]  [place] [address]  Operated by [facility]  Transitional Care Management Note    Name: Stacy Cantrell MRN:  [id number]   Date: 05/18/2024 Age: 43 y.o.     Chief Complaint: ED Follow-up     This note was created with assistance from [employer] via capture of conversational audio.  Consent was obtained from the patient prior to recording.      History of Present Illness  Stacy Cantrell is a 43 year old female who presents in follow-up after recent ER evaluation.    She initially sought medical attention on May 12, 2024, due to severe abdominal pain that made it difficult to stand upright and was accompanied by fever. Initial treatment with intravenous fluids and Toradol  provided minimal relief.    She returned to the hospital on May 15, 2024, with persistent symptoms and received additional treatment including four bags of fluids, two Toradol  shots, two Zofran  shots, and four red pills. A CT scan was performed and signs of inflammation was identified.  Stool cultures positive for Campylobacter for which Zithromax  500 mg daily x3 days will given. Her abdominal pain has since resolved, and overall she is feeling better.    Her blood pressure was also elevated during ER evaluation reading 205/162 mmHg during the initial visit however patient was discharged home told to follow-up.  Today in office blood pressure 122/82.    She was also told that her CT scan showed a few tiny cysts in her left kidney and fatty liver. She has a family history of kidney problems, as her father has a non-functioning right kidney. Urine analysis showed mild proteinuria.    ER visit info:   [facility]  ED Primary Provider Note  Patient Name: Stacy Cantrell  Patient Age: 43 y.o.  Date of Birth: 1980/09/29     Chief Complaint: Abdominal Pain           History of Present Illness  Stacy Cantrell is a 44 y.o. female who had  concerns including Abdominal Pain.      History of Present Illness  43 year old female presents to the ED for abdominal pain, diarrhea.  Does report she is having frequent diarrhea with multiple episodes per day, over the past couple of days it has become strict with bright red blood.  Pain is diffuse across her abdomen, worse on the right side.  Had some nausea and decreased appetite but is tolerating some p.o. intake at home.  No vomiting.  Denies any fever.  Has felt fatigued/generally weak.  She was seen for the same symptoms on 11/13 in the [facility] ED, had an unremarkable workup, did have a GI panel obtained on 11/13 was positive for Campylobacter.  Patient denies any recent outdoor activities/camping/hiking/use of contaminated water.  Has no other known sick contacts.  She states she has had a prior hysterectomy, cholecystectomy.        Review of Systems  No other overt Review of Systems are noted to be positive except noted in the HPI.        Historical Data  History Reviewed This Encounter:  Reviewed        Physical Exam      ED Triage Vitals [05/15/24 1359]   BP (Non-Invasive) (!) 128/96   Heart Rate 98   Respiratory Rate 18   Temperature 36.6 C (97.9 F)   SpO2 100 %   Weight  98 kg (216 lb)   Height 1.626 m (5' 4)         Physical Exam  General: Alert, in no acute discomfort.  Skin: intact, warm, and dry. no rashes or lesions on exposed skin.  Head: normocephalic, atraumatic.  Neck: supple, no tenderness. trachea midline.  Eyes: Extraocular movements are intact. Normal conjunctiva.  ENT: Oral mucous membranes moist.  Posterior pharynx without erythema or exudate.  Cardiovascular: regular rate and rhythm. no murmurs, rubs, gallops.  Respiratory: Lungs are clear without any wheezes, rhonchi, rales  Abdomen: Bowel sounds normal. Abdomen soft and diffuse mild TTP. No guarding. No rebound. No masses or rigidity.   Musculoskeletal:  Moves all extremities equally. No edema or bony deformity.  Neurologic:  Awake alert and oriented.  No focal neurologic deficits. normal speech.  Psychiatric: Cooperative. Appropriate mood and affect.           Procedures        Patient Data        Labs Ordered/Reviewed   LACTIC ACID LEVEL W/ REFLEX FOR LEVEL >2.0 - Abnormal; Notable for the following components:       Result Value      LACTIC ACID 2.9 (*)       All other components within normal limits   CBC WITH DIFF - Abnormal; Notable for the following components:     RBC 4.94 (*)       HGB 14.5 (*)       MPV 6.8 (*)       All other components within normal limits   URINALYSIS, MACROSCOPIC - Abnormal; Notable for the following components:     PROTEIN 30 (*)       All other components within normal limits   COMPREHENSIVE METABOLIC PANEL, NON-FASTING - Normal     Narrative:      Estimated Glomerular Filtration Rate (eGFR) is calculated using the CKD-EPI (2021) equation, intended for patients 12 years of age and older. If gender is not documented or unknown, there will be no eGFR calculation.      LIPASE - Normal   URINALYSIS, MICROSCOPIC - Normal   LACTIC ACID - FIRST REFLEX - Normal   CBC/DIFF     Narrative:      The following orders were created for panel order CBC/DIFF.  Procedure                               Abnormality         Status                     ---------                               -----------         ------                     CBC WITH IPQQ[[id number]]                Abnormal            Final result                  Please view results for these tests on the individual orders.   URINALYSIS, MACROSCOPIC AND MICROSCOPIC W/CULTURE REFLEX     Narrative:      The following orders  were created for panel order URINALYSIS, MACROSCOPIC AND MICROSCOPIC W/CULTURE REFLEX.  Procedure                               Abnormality         Status                     ---------                               -----------         ------                     URINALYSIS, MACROSCOPIC[[id number]]      Abnormal            Final result                URINALYSIS, MICROSCOPIC[[id number]]      Normal              Final result                  Please view results for these tests on the individual orders.         CT ABDOMEN PELVIS W IV CONTRAST   Final Result by Edi, Radresults In (11/16 1515)   Development of mild pancolitis, greatest involving the ascending colon. This could be infectious or inflammatory. No perforation or abscess.       Please see above for additional findings and details.                   Radiologist location ID: [id number]                 Medical Decision Making  Medical Decision Making  Risk  Prescription drug management.          Results        MDM Narrative:  Medical Decision Making  Patient evaluated in ED for abdominal pain, diarrhea.  Symptoms present since last week, progressively worsening over this timeframe.  Did have some bright red blood mixed in with stool.  No anticoagulation use.  She was afebrile, hemodynamically stable in ED.  Clinically well appearing overall.  Was given a L of IV fluids and Zofran  with improvement in her symptoms, also given Toradol .  She had workup obtained.  CT did show mild pan colitis, no perforation/abscess or other acute process.  On lab studies CBC shows no leukocytosis or anemia.  CMP with normal electrolytes renal function and LFTs.  UA did not show any signs of UTI.  Did have an elevated lactate to 2.9 however does not have any other vital sign derangements to suggest sepsis at this time.  After IV fluids this was rechecked and was found to be entirely normal.  Patient felt improved and comfortable with discharge home.  As her symptoms in the setting of a Campylobacter diarrheal illness has been progressive, she previously had a normal CT abdomen and pelvis 3 days ago and now showing mild pan colitis we will elect to treat with antibiotic.  She was given a 1 time dose of 1 g azithromycin  per up-to-date guidelines.  She states that her PCP office is going to call her tomorrow with an appointment  scheduled for close follow up, did advise her if she has continued symptoms at the time she  may require further antibiotics or workup.  Return precautions discussed, discharged in stable clinical condition.                                   Medications Ordered/Administered in the ED   NS bolus infusion 1,000 mL (0 mL Intravenous Stopped 05/15/24 1524)   ketorolac  (TORADOL ) 30 mg/mL injection (30 mg Intravenous Given 05/15/24 1424)   ondansetron  (ZOFRAN ) 2 mg/mL injection (4 mg Intravenous Given 05/15/24 1424)   iopamidol (ISOVUE-300) 61% solution (100 mL Intravenous Given 05/15/24 1449)   ondansetron  (ZOFRAN ) 2 mg/mL injection (4 mg Intravenous Given 05/15/24 1712)   azithromycin  (ZITHROMAX ) tablet (1,000 mg Oral Given 05/15/24 1711)         Following the history, physical exam, and ED workup, the patient was deemed stable and suitable for discharge. The patient/caregiver was advised to return to the ED for any new or worsening symptoms. Discharge medications, and follow-up instructions were discussed with the patient/caregiver in detail, who verbalizes understanding. The patient/caregiver is in agreement and is comfortable with the plan of care.     Disposition: Discharged            Current Discharge Medication List          CONTINUE these medications which have CHANGED during your visit.         Details   ondansetron  4 mg Tablet, Rapid Dissolve  Commonly known as: ZOFRAN  ODT  What changed:   medication strength  how much to take  how to take this    4 mg, Oral, EVERY 8 HOURS PRN  Qty: 12 Tablet  Refills: 0                CONTINUE these medications - NO CHANGES were made during your visit.         Details   ARIPiprazole  2 mg Tablet  Commonly known as: ABILIFY     2 mg, Oral, Daily  Qty: 90 Tablet  Refills: 1      Centrum 18-400 mg-mcg Tablet  Generic drug: multivitamin-iron-folic acid     1 Tablet, Daily  Refills: 0      cetirizine  10 mg Tablet  Commonly known as: zyrTEC     10 mg, Oral, Daily  Qty: 90  Tablet  Refills: 1      cyanocobalamin  1,000 mcg/mL Solution  Commonly known as: VITAMIN B12    INJECT 1ML UNDER THE SKIN EVERY 30 DAYS  Qty: 9 mL  Refills: 1      cyclobenzaprine 10 mg Tablet  Commonly known as: FLEXERIL    10 mg, Oral, 3 TIMES DAILY PRN  Qty: 12 Tablet  Refills: 0      escitalopram  oxalate 20 mg Tablet  Commonly known as: LEXAPRO     20 mg, Oral, Daily  Qty: 90 Tablet  Refills: 1      Ibuprofen  800 mg Tablet  Commonly known as: MOTRIN     800 mg, Oral, 3 TIMES DAILY PRN  Qty: 90 Tablet  Refills: 5      ipratropium-albuteroL  0.5 mg-3 mg(2.5 mg base)/3 mL nebulizer solution  Commonly known as: DUONEB    3 mL, Nebulization, EVERY 4 HOURS PRN  Qty: 90 Each  Refills: 2      ketorolac  tromethamine  10 mg Tablet  Commonly known as: TORADOL     10 mg, Oral, EVERY 6 HOURS PRN  Qty: 20 Tablet  Refills: 0  meloxicam 15 mg Tablet  Commonly known as: MOBIC    TAKE 1 TABLET BY MOUTH EVERY DAY AS NEEDED FOR PAIN WITH FOOD  Refills: 0      metFORMIN  500 mg Tablet  Commonly known as: GLUCOPHAGE     500 mg, Oral, EVERY MORNING WITH BREAKFAST  Qty: 90 Tablet  Refills: 1      olmesartan  40 mg Tablet  Commonly known as: BENICAR     40 mg, Oral, Daily  Qty: 90 Tablet  Refills: 1      phentermine  37.5 mg Tablet  Commonly known as: ADIPEX-P     37.5 mg, Oral, EVERY MORNING BEFORE BREAKFAST  Qty: 30 Tablet  Refills: 2      prochlorperazine  10 mg Tablet  Commonly known as: COMPAZINE     10 mg, Oral, EVERY 6 HOURS PRN  Qty: 30 Tablet  Refills: 0      rosuvastatin  5 mg Tablet  Commonly known as: CRESTOR     5 mg, Oral, EVERY EVENING  Qty: 90 Tablet  Refills: 1      semaglutide  0.25 mg or 0.5 mg (2 mg/3 mL) Pen Injector  Commonly known as: OZEMPIC     0.5 mg, Subcutaneous, EVERY 7 DAYS  Qty: 3 mL  Refills: 2      temazepam  7.5 mg Capsule  Commonly known as: RESTORIL     7.5 mg, Oral, NIGHTLY PRN  Qty: 90 Capsule  Refills: 0      topiramate  50 mg Tablet  Commonly known as: TOPAMAX     50 mg, Oral, Daily  Qty: 90 Tablet  Refills: 1                 Follow up:   Quantrell Splitt, PA-C  [address]  [address]  [place] [address]  [phone]     Schedule an appointment as soon as possible for a visit in 3 days        [facility] - Emergency Department  [address]  [place]  [address]  [phone]  Go to   If symptoms worsen     [facility] [place] - Emergency Department  ED Primary Note  History of Present Illness  Stacy Cantrell is a 43 year old female with right flank and lower right quadrant pain.     Pain symptoms  - Significant pain originating from the right side of the flank and radiating to the right lower quadrant  - Onset last night  - Initial pain severity 3/10, escalating to 7/10 approximately 1.5 hours prior to presentation     Associated constitutional and gastrointestinal symptoms  - Chills and sweating last night  - Nausea without vomiting  - Decreased oral intake today  - Change in bowel habits with runny stools  - Currently feels hot and warm, but unable to recall exact temperature     Urinary and neurological symptoms  - No current urinary symptoms     Relevant medical and surgical history  - History of hypertension, currently takes antihypertensive medication daily  - Confirmed taking blood pressure medication this morning  - History of knee arthritis surgery  - History of carpal tunnel surgery  - No history of kidney stones  - Father with history of kidney stones  - History of bladder infection approximately seven years ago  Physical Exam      ED Triage Vitals [05/12/24 1836]   BP (Non-Invasive) (!) 152/107   Heart Rate (!) 116  Respiratory Rate 18   Temperature 37.4 C (99.4 F)   SpO2 96 %   Weight 99.9 kg (220 lb 3.2 oz)   Height 1.65 m (5' 4.96)      Physical Exam  GENERAL: Alert, cooperative, well developed, no acute distress  HEENT: Normocephalic, normal oropharynx, moist mucous membranes  CHEST: Clear to auscultation bilaterally, No wheezes, rhonchi, or  crackles  CARDIOVASCULAR: Normal heart rate and rhythm, S1 and S2 normal without murmurs  ABDOMEN: Soft, RUQ tender, non-distended, without organomegaly, Normal bowel sounds  EXTREMITIES: No cyanosis or edema  NEUROLOGICAL: Cranial nerves grossly intact, Moves all extremities without gross motor or sensory deficit  Patient Data  Results  LABS  Potassium: 3.2 (05/12/2024)  Urinalysis: Unremarkable (05/12/2024)     RADIOLOGY  CT abdomen pelvis: No acute inflammatory findings. Mild prominence of the right renal collecting system in proximal right ureter with gradual tapering. No evidence of a urinary tract calculus. Hepatic steatosis noted. Otherwise unremarkable. (05/12/2024)           Labs Ordered/Reviewed   COMPREHENSIVE METABOLIC PANEL, NON-FASTING - Abnormal; Notable for the following components:       Result Value      POTASSIUM 3.2 (*)       All other components within normal limits     Narrative:      Estimated Glomerular Filtration Rate (eGFR) is calculated using the CKD-EPI (2021) equation, intended for patients 38 years of age and older. If gender is not documented or unknown, there will be no eGFR calculation.   CBC WITH DIFF - Abnormal; Notable for the following components:     RBC 5.04 (*)       HGB 14.7 (*)       HCT 43.2 (*)       MPV 6.9 (*)       All other components within normal limits   LIPASE - Normal   URINALYSIS, MACRO/MICRO - Normal   LACTIC ACID LEVEL W/ REFLEX FOR LEVEL >2.0 - Normal   ADULT ROUTINE BLOOD CULTURE, SET OF 2 BOTTLES (BACTERIA AND YEAST)   ADULT ROUTINE BLOOD CULTURE, SET OF 2 BOTTLES (BACTERIA AND YEAST)   GI PANEL BY BIOFIRE FILM ARRAY   C. DIFFICILE PCR   CBC/DIFF     Narrative:      The following orders were created for panel order CBC/DIFF.  Procedure                               Abnormality         Status                     ---------                               -----------         ------                     CBC WITH IPQQ[[id number]]                Abnormal             Final result                  Please view results for these tests on the individual orders.   URINALYSIS WITH REFLEX MICROSCOPIC AND CULTURE IF POSITIVE  Narrative:      The following orders were created for panel order URINALYSIS WITH REFLEX MICROSCOPIC AND CULTURE IF POSITIVE.  Procedure                               Abnormality         Status                     ---------                               -----------         ------                     URINALYSIS, MACRO/MICRO[[id number]]      Normal              Final result                  Please view results for these tests on the individual orders.      CT ABDOMEN PELVIS W IV CONTRAST   Final Result by Edi, Radresults In (11/13 2121)       No acute inflammatory findings in the abdomen or pelvis.       Mild prominence of the right renal collecting system and proximal right ureter with gradual tapering. No evidence of urinary tract calculi.       Hepatic steatosis.                           Radiologist location ID: [id number]              Medical Decision Making        Medical Decision Making  43 year old female with a history of hypertension, status post hysterectomy, presented with acute right flank pain radiating into the right lower quadrant, fever with chills and sweating, nausea, and diarrhea since last night. She reported increased pain severity, subjective fever, and minimal oral intake. No history of similar episodes, kidney stones, or recent urinary tract infections. No recent medication use except for antihypertensives and ibuprofen .     Differential diagnosis includes, but is not limited to:  - Renal Colic: Considered due to pain radiating up her leg and family history, but absence of urinary symptoms and no personal history of kidney stones make this less likely.  - Appendicitis or Other Intra-abdominal Pathology: Considered due to acute right foot pain with fever and gastrointestinal symptoms; further evaluation is needed to distinguish among  intra-abdominal causes.     Acute right foot pain radiating up leg, fever, nausea, and diarrhea  - Administered ibuprofen  for pain management  - Ordered laboratory tests to investigate underlying causes  Medical Decision Making  Amount and/or Complexity of Data Reviewed  Independent Historian: friend  Labs: ordered. Decision-making details documented in ED Course.  Radiology: ordered. Decision-making details documented in ED Course.  ECG/medicine tests: independent interpretation performed. Decision-making details documented in ED Course.    Risk  Prescription drug management.           ED Course as of 05/12/24 2215   Thu May 12, 2024   1910 CT made aware to change the CT scan to with oral contrast.   1917 CBC/DIFF(!)  H&H 14.7 and 43.2   1935 URINALYSIS WITH REFLEX MICROSCOPIC AND CULTURE IF  POSITIVE  Unremarkable   1944 LACTIC ACID: 1.3   2013 COMPREHENSIVE METABOLIC PANEL, NON-FASTING(!)  Potassium 3.2 otherwise unremarkable   2014 LIPASE: 51   2124 CT ABDOMEN PELVIS W IV CONTRAST  No acute inflammatory findings in the abdomen or pelvis.     Mild prominence of the right renal collecting system and proximal right ureter with gradual tapering. No evidence of urinary tract calculi.     Hepatic steatosis.      Medications Ordered/Administered in the ED   NS bolus infusion 1,000 mL (0 mL Intravenous Stopped 05/12/24 1957)   ketorolac  (TORADOL ) 30 mg/mL injection (30 mg Intravenous Given 05/12/24 1857)   iopamidol (ISOVUE-370) 76% solution (75 mL Intravenous Given 05/12/24 2111)   potassium bicarbonate-citric acid (EFFER-K) effervescent tablet (50 mEq Oral Given 05/12/24 2142)      Clinical Impression   Right lower quadrant abdominal pain (Primary)   Diarrhea, unspecified type   Nausea   Hypokalemia         Disposition: Discharged     Allergies[1]     ARIPiprazole  (ABILIFY ) 2 mg Oral Tablet, Take 1 Tablet (2 mg total) by mouth Daily  azithromycin  (ZITHROMAX ) 500 mg Oral Tablet, Take 1 Tablet (500 mg total) by mouth  Daily  cetirizine  (ZYRTEC ) 10 mg Oral Tablet, TAKE 1 TABLET BY MOUTH EVERY DAY AS NEEDED  cyanocobalamin  (VITAMIN B12) 1,000 mcg/mL Injection Solution, INJECT 1ML UNDER THE SKIN EVERY 30 DAYS  cyclobenzaprine (FLEXERIL) 10 mg Oral Tablet, Take 1 Tablet (10 mg total) by mouth Three times a day as needed for Muscle spasms  escitalopram  oxalate (LEXAPRO ) 20 mg Oral Tablet, Take 1 Tablet (20 mg total) by mouth Daily  Ibuprofen  (MOTRIN ) 800 mg Oral Tablet, Take 1 Tablet (800 mg total) by mouth Three times a day as needed for Pain  ketorolac  tromethamine  (TORADOL ) 10 mg Oral Tablet, Take 1 Tablet (10 mg total) by mouth Every 6 hours as needed for Pain  meloxicam (MOBIC) 15 mg Oral Tablet, TAKE 1 TABLET BY MOUTH EVERY DAY AS NEEDED FOR PAIN WITH FOOD  metFORMIN  (GLUCOPHAGE ) 500 mg Oral Tablet, Take 1 Tablet (500 mg total) by mouth Every morning with breakfast  multivitamin-iron-folic acid  (CENTRUM) 18-400 mg-mcg Oral Tablet, Take 1 Tablet by mouth Once a day  olmesartan  (BENICAR ) 40 mg Oral Tablet, Take 1 Tablet (40 mg total) by mouth Daily  ondansetron  (ZOFRAN  ODT) 4 mg Oral Tablet, Rapid Dissolve, Take 1 Tablet (4 mg total) by mouth Every 8 hours as needed for Nausea/Vomiting  phentermine  (ADIPEX-P ) 37.5 mg Oral Tablet, Take 1 Tablet (37.5 mg total) by mouth Every morning before breakfast  prochlorperazine  (COMPAZINE ) 10 mg Oral Tablet, Take 1 Tablet (10 mg total) by mouth Every 6 hours as needed for Nausea/Vomiting  rosuvastatin  (CRESTOR ) 5 mg Oral Tablet, Take 1 Tablet (5 mg total) by mouth Every evening  semaglutide (OZEMPIC) 0.25 mg or 0.5 mg (2 mg/3 mL) Subcutaneous Pen Injector, Inject 0.5 mg under the skin Every 7 days  temazepam  (RESTORIL ) 7.5 mg Oral Capsule, Take 1 Capsule (7.5 mg total) by mouth Every night as needed for Insomnia  topiramate  (TOPAMAX ) 50 mg Oral Tablet, Take 1 Tablet (50 mg total) by mouth Once a day  ipratropium-albuterol  0.5 mg-3 mg(2.5 mg base)/3 mL Solution for Nebulization, Take 3 mL by  nebulization Every 4 hours as needed for Wheezing (Patient not taking: Reported on 05/11/2024)    No facility-administered medications prior to visit.     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      Social History     Socioeconomic History    Marital status: Married   Tobacco Use    Smoking status: Former     Current packs/day: 0.00     Average packs/day: 0.5 packs/day for 15.7 years (7.9 ttl pk-yrs)     Types: Cigarettes     Start date: 10/06/1998     Quit date: 06/30/2014     Years since quitting: 9.8    Smokeless tobacco: Never   Vaping Use    Vaping status: Never Used   Substance and Sexual Activity    Alcohol use: Yes     Alcohol/week: 4.0 standard drinks of alcohol     Types: 4 Glasses of wine per week    Drug use: Never    Sexual activity: Yes     Partners: Male      OBJECTIVE:   BP 122/82 (Site: Left Arm, Patient Position: Sitting, Cuff Size: Adult)   Pulse 90   Ht 1.626 m (5' 4)   Wt 102 kg (225 lb)   SpO2 98%   BMI 38.62 kg/m      Physical Exam  Vitals and nursing note reviewed.   Constitutional:       Appearance: Normal appearance.   HENT:      Right Ear: Tympanic membrane normal.      Left Ear: Tympanic membrane normal.      Nose: No congestion.      Mouth/Throat:      Mouth: Mucous membranes are moist.      Pharynx: Oropharynx is clear.   Eyes:      Pupils: Pupils are equal, round, and reactive to light.   Cardiovascular:      Rate and Rhythm: Normal rate and regular rhythm.      Pulses: Normal pulses.   Pulmonary:      Effort: Pulmonary effort is normal.      Breath sounds:  No wheezing rhonchi  Abdominal:      Palpations: Abdomen is soft.      Tenderness: There is no abdominal tenderness.   Musculoskeletal:         General: Normal range of motion.   Skin:      General: Skin is warm.      Findings: No rash.   Neurological:      Mental Status: She is alert and oriented to person, place, and time.   Psychiatric:         Mood and Affect: Mood normal.         Behavior: Behavior normal.         Thought Content: Thought content normal.         Judgment: Judgment normal.     Transition of Care Contact Information  Day, [place], [place]  KD    05/16/24  4:38 PM  Note  Post Ed Follow-Up    Post ED Follow-Up:   Document completed and/or attempted interactive contact(s) after transition to home after emergency department stay.:   Transition Facility and relevant Date:   Discharge Date: 05/15/24  Discharge from [facility] Emergency Department?: Yes  Discharge Facility: [facility]  Contacted by: Ileana Michae LATHER  Contact method: Patient/Caregiver Telephone  Contact completed: 05/16/2024  4:36 PM  MyChart message sent?: No  Was the AVS reviewed with patient?: Yes  Did the patient attempt to reach their PCP prior to going to the ED?: No  How is the patient recovering?: No better or worse  Medications prescribed: Yes  Were they obtained?: Yes  Interventions: No needs identified  Follow Up Visit: Follow Up Appt Scheduled  Patient states that she went to the ER with abdominal pain in the right side around to her right kidney. Patient states that the pain is still not any better and she is just not feeling the best. Follow up appointment was made with the office for 05/18/24.         Data Reviewed  Medication Reconciliation completed    Assessment & Plan  Hospital discharge follow-up  Hospital notes reviewed in detail  Abdominal pain, unspecified abdominal location  Abdominal pain now resolved. Pancolitis likely secondary to Campylobacter infection   CT showed ascending colon inflammation.  Patient started on Zithromax  500 mg daily for 3 days.  - Recommended probiotic supplementation, such as Align or kefir.  Campylobacter enteritis    Mild pancolitis secondary to Campylobacter infection  confirmed by positive test. CT showed ascending colon inflammation.  Patient is started on Zithromax  so 100 mg daily x3 days.  - Recommended probiotic supplementation, such as Align or kefir.  Renal cyst  Presumed tiny cysts of left kidney  CT revealed tiny presumed cysts in left kidney, too small to characterize. No acute abnormalities in kidneys or adrenal glands. No kidney stones. Family history of kidney problems noted.  - Ordered baseline ultrasound of kidneys to monitor cysts.  Hypertension, unspecified type  Blood pressure improved from 205/162 mmHg to 122/82 mmHg.  Currently on Benicar  40 mg daily.  -continue Benicar  along with blood pressure monitoring     Other transition actions (Optional) -: Discharge documentation was reviewed    Orders Placed This Encounter    US  KIDNEY WITH BLADDER        Post-Discharge Follow Up Appointments       Wednesday Jun 15, 2024    Return Patient Visit with Scharlene No, PA-C at  9:45 AM      Friday Jun 17, 2024    PRN Ultrasound with PRN US  ROOM 4 at  2:00 PM      [facility], [place]   [address]  [place] [address]  [phone] Internal Medicine, Building A  [facility]  [address]  [place] [address]  [phone]             No Scharlene, PA-C         [1]   Allergies  Allergen Reactions    Codeine Hives/ Urticaria    Diphenhydramine Hcl Hives/ Urticaria    Penicillins Hives/ Urticaria    Diphenhydramine Itching

## 2024-05-18 NOTE — Assessment & Plan Note (Signed)
 Blood pressure improved from 205/162 mmHg to 122/82 mmHg.  Currently on Benicar  40 mg daily.  -continue Benicar  along with blood pressure monitoring

## 2024-05-20 MED ORDER — TEMAZEPAM 7.5 MG CAPSULE
7.5000 mg | ORAL_CAPSULE | Freq: Every evening | ORAL | 0 refills | Status: AC | PRN
Start: 2024-05-20 — End: ?

## 2024-05-24 ENCOUNTER — Ambulatory Visit (HOSPITAL_COMMUNITY)

## 2024-05-24 ENCOUNTER — Ambulatory Visit (INDEPENDENT_AMBULATORY_CARE_PROVIDER_SITE_OTHER): Payer: Self-pay | Admitting: Physician Assistant

## 2024-06-02 ENCOUNTER — Ambulatory Visit (INDEPENDENT_AMBULATORY_CARE_PROVIDER_SITE_OTHER): Payer: Self-pay | Admitting: OTOLARYNGOLOGY

## 2024-06-06 NOTE — H&P (Signed)
 CC:   R thyroid  nodule with compression     HPI:  R thyroid  nodule   Lab Results   Component Value Date    TSH 0.923 02/18/2021    FREET4 1.3 02/18/2021      Lab Results   Component Value Date    CL 102 12/11/2020      No results found for: VITD25  No results found for: CALC24HRURIN  Lab Results   Component Value Date    CA 9.1 12/11/2020      No results found for: PTH  Lab Results   Component Value Date    CREATININE 0.56 12/11/2020    GFR >90 12/11/2020     Patient Active Problem List   Diagnosis    Stress incontinence of urine    Dyspareunia, female    Pelvic pain    Depression    Anxiety    Insomnia    Seasonal allergies    Levator spasm    Cubital tunnel syndrome on right    History of elbow surgery    Tendonitis, Achilles, right    Plantar fasciitis of left foot    Numbness and tingling in left hand    Cubital tunnel syndrome    Tarsal tunnel syndrome, left    Multiple thyroid  nodules    Family history of thyroid  cancer    Family history of cancer     Past Surgical History:   Procedure Laterality Date    ENDOMETRIAL ABLATION  2015    Dr. Florina, [facility]    HX CHOLECYSTECTOMY      HX HYSTERECTOMY      2022    HX TUBAL LIGATION Bilateral 05/2003    pp BTL    HX VEIN STRIPPING  2017    Right leg.  [facility]    PR CHOLECYSTECTOMY  2007    [facility]    PR CYSTOURETHROSCOPY N/A 12/17/2020    Procedure: CYSTOSCOPY;  Surgeon: Jestine Elsie PARAS, MD;  Location: [facility] MAIN OR    PR LAPAROSCOPY W TOTAL HYSTERECTOMY UTERUS 250 GM/< Bilateral 12/17/2020    Procedure: HYSTERECTOMY, LAPAROSCOPIC TOTAL (LTH) UTERUS 250 G OR LESS DA VINCI;  Surgeon: Jestine Powell KIDD, MD;  Location: [facility] MAIN OR    PR LAPAROSCOPY W/RMVL ADNEXAL STRUCTURES Bilateral 12/17/2020    Procedure: SALPINGECTOMY, LAPAROSCOPIC W/ DA VINCI;  Surgeon: Jestine Powell KIDD, MD;  Location: [facility] MAIN OR    PR NEUROPLASTY &/TRANSPOSITION ULNAR NERVE ELBOW Right 05/20/2022    Procedure: ELBOW, CUBITAL TUNNEL RELEASE;  Surgeon: Adolm Laneta DEL, MD;  Location: [facility] MAIN OR    PR NEUROPLASTY &/TRANSPOSITION ULNAR NERVE ELBOW Left 11/05/2023    Procedure: ELBOW, CUBITAL TUNNEL RELEASE;  Surgeon: Adolm Laneta DEL, MD;  Location: [facility] AMB SURG    PR PELVIC EXAMINATION W/ANESTHESIA OTHER THAN LOCAL N/A 12/17/2020    Procedure: PELVIC EXAMINATION;  Surgeon: Jestine Powell KIDD, MD;  Location: [facility] MAIN OR    PR SLING OPERATION STRESS INCONTINENCE N/A 12/17/2020    Procedure: VAGINAL SLING PROCEDURE TVT;  Surgeon: Jestine Elsie PARAS, MD;  Location: [facility] MAIN OR     Allergies   Allergen Reactions    Benadryl [Diphenhydramine Hcl] Hives    Codeine Hives and Swelling    Penicillins Hives and Swelling     No current facility-administered medications on file prior to encounter.     Current Outpatient Medications on File Prior to Encounter   Medication Indication(s) Sig Dispense Refill    rosuvastatin  (CRESTOR ) 5  mg Tablet   take 5 mg by mouth .      phentermine  (ADIPEX-P ) 37.5 mg Tablet   take 37.5 mg by mouth .      pantoprazole (PROTONIX) 40 mg Tablet, Delayed Release (E.C.)   TAKE 1 TABLET BY MOUTH DAILY(BEFORE DINNER)      famotidine  (PEPCID ) 40 mg Tablet   take 40 mg every day by mouth .      cyclobenzaprine  (FLEXERIL ) 10 mg Tablet   take 10 mg by mouth . (Patient taking differently: take 10 mg in the morning and 10 mg before bedtime by mouth.)      metFORMIN  (GLUCOPHAGE ) 500 mg Tablet   take 500 mg by mouth . (Patient taking differently: take 500 mg every day by mouth .)      semaglutide  0.25 mg or 0.5 mg (2 mg/3 mL) Pen Injector   0.5 mg by Subcutaneous route .      ARIPiprazole  (ABILIFY ) 2 mg Tablet   take 2 mg every day by mouth .      cyanocobalamin  (VITAMIN B12) 1,000 mcg/mL Solution   1,000 mcg as directed by Subcutaneous route .      amLODIPine  (NORVASC) 5 mg Tablet   take 5 mg every day by mouth .      Levocetirizine 5 mg Tablet   take 5 mg every day by mouth .      escitalopram  oxalate (LEXAPRO ) 10 mg Tablet   take 10 mg every day by mouth  .      multivitamin rowell oral capsule Capsule   take 1 capsule every day by mouth .      ondansetron  (ZOFRAN  ODT) 4 mg Tablet, Rapid Dissolve   take 1 tablet every 8 (eight) hours as needed by mouth  for Nausea. 10 tablet 0    Calcium   take 1 mEq every day by mouth  PT TAKES 1 GUMMY ONCE DAILY W/CALCIUM AND B3. . (Patient not taking: Reported on 05/27/2024.)       Social History     Socioeconomic History    Marital status: Married   Tobacco Use    Smoking status: Former     Current packs/day: 0.00     Average packs/day: 1 pack/day for 8.0 years (8.0 ttl pk-yrs)     Types: Cigarettes     Start date: 09/26/2005     Quit date: 09/26/2013     Years since quitting: 10.7     Passive exposure: Never    Smokeless tobacco: Never   Vaping Use    Vaping status: Former    Start date: 07/01/2015    Substances: Flavoring    Devices: Disposable   Substance and Sexual Activity    Alcohol use: Yes     Alcohol/week: 2.0 standard drinks of alcohol     Types: 2 Glasses of wine per week     Comment: occasional    Drug use: Never    Sexual activity: Yes     Partners: Male   Social History Narrative    Married    3 daughters    1 granddaughter       Family History   Problem Relation Name Age of Onset    Hypertension Father      Elevated Lipids Father      Diabetes Maternal Grandmother      Hypertension Paternal Grandmother       ROS: no changes. Pertinent's in HPI  Patient Vitals for the past 24 hrs:  BP Temp Pulse Resp SpO2 Height Weight   06/06/24 0832 -- 97.6 F (36.4 C) -- -- -- -- --   06/06/24 0821 (!) 146/92 -- 92 18 90 % 1.6 m (5' 3) 101.6 kg (224 lb)   06/06/24 0820 -- -- 95 -- 90 % -- --   06/06/24 0819 -- -- 96 -- 92 % -- --   06/06/24 0818 -- -- 93 -- 92 % -- --     PEx   AAOX3, NAD.  WNWD   RRR   CTA   SOFT    The risks benefits and indications for surgery including the potential risks were all explained to the patient prior to the procedure.  The patient was given an opportunity to ask questions which were  answered. Written informed consent was obtained, and the patient wished to proceed.    Assesment:  R thyroid  nodule  Plan:   For a R thyroidectomy     Toribio JONELLE Dolphin MD, [facility]  Endocrine Surgery & Advanced Laparoscopy

## 2024-06-10 ENCOUNTER — Emergency Department (HOSPITAL_BASED_OUTPATIENT_CLINIC_OR_DEPARTMENT_OTHER)

## 2024-06-10 ENCOUNTER — Other Ambulatory Visit: Payer: Self-pay

## 2024-06-10 ENCOUNTER — Encounter (HOSPITAL_BASED_OUTPATIENT_CLINIC_OR_DEPARTMENT_OTHER): Payer: Self-pay

## 2024-06-10 ENCOUNTER — Emergency Department
Admission: EM | Admit: 2024-06-10 | Discharge: 2024-06-10 | Disposition: A | Discharge status: Home / Self Care | Attending: FAMILY PRACTICE | Admitting: FAMILY PRACTICE

## 2024-06-10 DIAGNOSIS — C73 Malignant neoplasm of thyroid gland: Secondary | ICD-10-CM | POA: Insufficient documentation

## 2024-06-10 DIAGNOSIS — Z90710 Acquired absence of both cervix and uterus: Secondary | ICD-10-CM | POA: Insufficient documentation

## 2024-06-10 DIAGNOSIS — R1114 Bilious vomiting: Secondary | ICD-10-CM

## 2024-06-10 DIAGNOSIS — R112 Nausea with vomiting, unspecified: Secondary | ICD-10-CM | POA: Insufficient documentation

## 2024-06-10 DIAGNOSIS — R1310 Dysphagia, unspecified: Secondary | ICD-10-CM

## 2024-06-10 DIAGNOSIS — G8918 Other acute postprocedural pain: Secondary | ICD-10-CM | POA: Insufficient documentation

## 2024-06-10 DIAGNOSIS — F419 Anxiety disorder, unspecified: Secondary | ICD-10-CM | POA: Insufficient documentation

## 2024-06-10 LAB — CBC WITH DIFF
BASOPHIL #: 0.05 x10ˆ3/uL (ref 0.00–0.10)
BASOPHIL %: 0 % (ref 0–1)
EOSINOPHIL #: 0.87 x10ˆ3/uL — ABNORMAL HIGH (ref 0.00–0.50)
EOSINOPHIL %: 7 % (ref 1–7)
HCT: 45.7 % — ABNORMAL HIGH (ref 31.2–41.9)
HGB: 15.4 g/dL — ABNORMAL HIGH (ref 10.9–14.3)
LYMPHOCYTE #: 2.68 x10ˆ3/uL (ref 1.10–3.10)
LYMPHOCYTE %: 22 % (ref 16–46)
MCH: 28.9 pg (ref 24.7–32.8)
MCHC: 33.8 g/dL (ref 32.3–35.6)
MCV: 85.4 fL (ref 75.5–95.3)
MONOCYTE #: 0.68 x10ˆ3/uL (ref 0.20–0.90)
MONOCYTE %: 6 % (ref 4–11)
MPV: 6.9 fL — ABNORMAL LOW (ref 7.9–10.8)
NEUTROPHIL #: 8 x10ˆ3/uL (ref 1.90–8.20)
NEUTROPHIL %: 65 % (ref 43–77)
PLATELETS: 274 x10ˆ3/uL (ref 140–440)
RBC: 5.35 x10ˆ6/uL — ABNORMAL HIGH (ref 3.63–4.92)
RDW: 15.3 % (ref 12.3–17.7)
WBC: 12.3 x10ˆ3/uL — ABNORMAL HIGH (ref 3.8–11.8)

## 2024-06-10 LAB — BASIC METABOLIC PANEL
ANION GAP: 10 mmol/L (ref 4–13)
BUN/CREA RATIO: 11
BUN: 10 mg/dL (ref 7–18)
CALCIUM: 9.5 mg/dL (ref 8.5–10.1)
CHLORIDE: 101 mmol/L (ref 98–107)
CO2 TOTAL: 29 mmol/L (ref 21–32)
CREATININE: 0.88 mg/dL (ref 0.55–1.02)
ESTIMATED GFR: 84 mL/min/1.73mˆ2 (ref >59)
GLUCOSE: 104 mg/dL (ref 74–106)
OSMOLALITY, CALCULATED: 279 mosm/kg (ref 270–290)
POTASSIUM: 4.3 mmol/L (ref 3.5–5.1)
SODIUM: 140 mmol/L (ref 136–145)

## 2024-06-10 MED ORDER — ONDANSETRON 4 MG DISINTEGRATING TABLET: 4.0000 mg | ORAL_TABLET | Freq: Three times a day (TID) | ORAL | 0 refills | Status: AC | PRN

## 2024-06-10 NOTE — Discharge Instructions (Addendum)
 Your CT shows no encroachment on the airway or swelling that includes your compresses the airway.  Normal and routine soft tissue changes and inflammation are noted, your white blood cell count is minimally elevated at 12,300, to be expected with stresses of surgery.    The small amount of bleeding that you had with your vomiting could have been a Mallory-Weiss tear, which has a small mucosal tear in the lower esophagus when you vomit.    Bland diet for 3 or 4 days, continue home medications as prescribed, there Zofran  at the pharmacy for you to pick up and use if needed for nausea and vomiting.

## 2024-06-10 NOTE — ED Provider Notes (Signed)
 [facility], [place] - Emergency Department  ED Primary Note  History of Present Illness   Stacy Cantrell is a 43 y.o. female who had concerns including Vomiting.  This 43 year old female patient presents emergency department today, she is here from follow up after having a right thyroid  lobectomy on Tuesday, started vomiting today and she has been vomiting blood and feels like there has a not on her thyroid  and she is having difficulty breathing and swallowing.  She has not been around any way with illness, has a only taking 1 oxycodone  and neck with a day after surgery,    Past medical history:  Depression, anxiety, gastroenteritis, insomnia, headache syndromes, allergic rhinitis, thyroid  cancer.    Past surgical history:  Right lobe thyroidectomy, hysterectomy, carpal tunnel release right, vascular surgery on the right and left knee arthroscopy.    Social history: Former smoker never vaped, never use marijuana or illicit drugs.  She does use social alcohol.    Physical Exam   ED Triage Vitals [06/10/24 1828]   BP (Non-Invasive) (!) 161/117   Heart Rate (!) 125   Respiratory Rate (!) 22   Temperature 36.8 C (98.2 F)   SpO2 100 %   Weight 102 kg (225 lb)   Height 1.6 m (5' 3)     Physical Exam  General:  No acute distress nontoxic, anxious   Nasal:  Mildly injected, moist.    Oral:  Oral cavity is pink and moist   Pharynx:  Pink and moist without PND, exudate, petechiae, pharynx is wide open and patent as visualized with a tongue depressor.    Neck:  Adenopathy, she does has a central to right lower neck transverse incision still sealed, closely approximated, does have some mild expected erythema and fullness developing along the incision line.  No other signs of bleeding, or hematoma.  Lungs:  Clear symmetrical good aeration   Heart: Regular rate rhythm without murmur.    Patient Data   Labs Ordered/Reviewed   CBC WITH DIFF - Abnormal; Notable for the following components:       Result  Value    WBC 12.3 (*)     RBC 5.35 (*)     HGB 15.4 (*)     HCT 45.7 (*)     MPV 6.9 (*)     EOSINOPHIL # 0.87 (*)     All other components within normal limits   CBC/DIFF    Narrative:     The following orders were created for panel order CBC/DIFF.  Procedure                               Abnormality         Status                     ---------                               -----------         ------                     CBC WITH IPQQ[[id number]]                Abnormal            Final result  Please view results for these tests on the individual orders.   BASIC METABOLIC PANEL    Narrative:     Estimated Glomerular Filtration Rate (eGFR) is calculated using the CKD-EPI (2021) equation, intended for patients 19 years of age and older. If gender is not documented or unknown, there will be no eGFR calculation.     CT SOFT TISSUE NECK WO IV CONTRAST   Final Result by Edi, Rad[facility] In (12/12 1932)   1. THERE IS FLUID AND SOFT TISSUE STRANDING IN THE ANTERIOR NECK ANTERIOR TO THE RIGHT THYROID  BED. THERE IS A METALLIC DENSITY IN THE SOFT TISSUES ANTERIOR TO THE TRACHEA. NO MASS EFFECT UPON THE AIRWAY.               Radiologist location ID: [id number]           Medical Decision Making        Medical Decision Making  This 42 year old female patient presents emergency department with nausea and vomiting since Tuesday, has a right lobe thyroidectomy on Monday secondary to thyroid  cancer.  She only took 1 oxycodone , doubt that that is continuing nausea and vomiting.  She has had no fever, headaches or body aches beyond the norm and denies any illness at home were illness exposures.  She has a no diarrhea but does has a nausea and vomiting.  Patient does not have Zofran  at home.  She has a moist mucosal membranes, pharynx is patent in the visualized portion of the pharynx with a tongue depressor, neck is supple, no signs of hematoma around the surgical incision site, the expected firmness of the incision  inflammatory changes with no drainage.  Her neck is otherwise supple, lungs are clear symmetrical, heart regular rate rhythm without murmur.    Differential includes Mallory-Weiss tear for hematemesis, gastroenteritis, postoperative pain, anxiety.    Labs:  Labs essentially unremarkable, 12.3 white count with neutrophilic shift which has a expected, BNP is normal.    CT soft tissue neck:  Airway patent, no compression, inflammatory changes postoperatively from the surgery is noted.    Amount and/or Complexity of Data Reviewed  Labs: ordered.     Details: Labs reviewed and noted  Radiology: ordered.     Details: CT soft tissue neck reviewed and noted    Risk  Prescription drug management.                   Clinical Impression   Bilious vomiting with nausea (Primary)   Anxiety   Postoperative pain   Thyroid  cancer (CMS HCC)       Disposition: Discharged

## 2024-06-10 NOTE — ED Nurses Note (Signed)
 Pt with new orders to DC home. Ensured that dc note was updated by physician. Discussed dc wtih patient. Printed dc paperwork and provided pt wtih a copy, reading over important points to pt. All of the pt's questions answered at this time. Provided pt with clinic phone number for follow-up questions.  Pt transported off ward via ambulatory with friend.

## 2024-06-13 ENCOUNTER — Encounter (INDEPENDENT_AMBULATORY_CARE_PROVIDER_SITE_OTHER): Payer: Self-pay | Admitting: Physician Assistant

## 2024-06-13 NOTE — Assessment & Plan Note (Signed)
 Chronic anxiety stable with use of Lexapro  and Abilify .  -continue current medication

## 2024-06-13 NOTE — Assessment & Plan Note (Signed)
 Chronic insomnia stable with use of Restoril  7.5 mg nightly.  -continue Restoril  as directed nightly PRN

## 2024-06-13 NOTE — Assessment & Plan Note (Signed)
 Elevated blood pressure with history of hypertension noted.  Patient currently on Benicar  20 mg daily.  -increase Benicar  40 mg daily  -continue blood pressure monitoring

## 2024-06-13 NOTE — Assessment & Plan Note (Signed)
 History of elevated cholesterol with current use of Crestor  5 mg nightly.  Weight gain once again reported.  -obtain cholesterol levels in blood work  -continue Crestor  5 mg nightly with diet

## 2024-06-13 NOTE — Assessment & Plan Note (Signed)
 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  -obtain vitamin-D level and blood work  -continue vitamin-D supplement

## 2024-06-14 ENCOUNTER — Other Ambulatory Visit: Payer: Self-pay

## 2024-06-15 ENCOUNTER — Ambulatory Visit: Payer: Self-pay | Attending: Physician Assistant | Admitting: Physician Assistant

## 2024-06-15 ENCOUNTER — Encounter (HOSPITAL_COMMUNITY): Payer: Self-pay | Admitting: Family Medicine

## 2024-06-15 ENCOUNTER — Encounter (INDEPENDENT_AMBULATORY_CARE_PROVIDER_SITE_OTHER): Payer: Self-pay | Admitting: Physician Assistant

## 2024-06-15 ENCOUNTER — Other Ambulatory Visit: Payer: Self-pay

## 2024-06-15 ENCOUNTER — Emergency Department
Admission: EM | Admit: 2024-06-15 | Discharge: 2024-06-15 | Disposition: A | Discharge status: Home / Self Care | Source: Ambulatory Visit | Attending: Family Medicine | Admitting: Family Medicine

## 2024-06-15 VITALS — BP 104/82 | HR 96 | Ht 63.0 in | Wt 224.0 lb

## 2024-06-15 DIAGNOSIS — I10 Essential (primary) hypertension: Secondary | ICD-10-CM | POA: Insufficient documentation

## 2024-06-15 DIAGNOSIS — Z9889 Other specified postprocedural states: Secondary | ICD-10-CM

## 2024-06-15 DIAGNOSIS — E7800 Pure hypercholesterolemia, unspecified: Secondary | ICD-10-CM | POA: Insufficient documentation

## 2024-06-15 DIAGNOSIS — R1310 Dysphagia, unspecified: Secondary | ICD-10-CM

## 2024-06-15 DIAGNOSIS — R1110 Vomiting, unspecified: Secondary | ICD-10-CM | POA: Insufficient documentation

## 2024-06-15 DIAGNOSIS — R9389 Abnormal findings on diagnostic imaging of other specified body structures: Secondary | ICD-10-CM | POA: Insufficient documentation

## 2024-06-15 DIAGNOSIS — M2550 Pain in unspecified joint: Secondary | ICD-10-CM | POA: Insufficient documentation

## 2024-06-15 DIAGNOSIS — E890 Postprocedural hypothyroidism: Secondary | ICD-10-CM | POA: Insufficient documentation

## 2024-06-15 DIAGNOSIS — R112 Nausea with vomiting, unspecified: Secondary | ICD-10-CM

## 2024-06-15 DIAGNOSIS — Z9009 Acquired absence of other part of head and neck: Secondary | ICD-10-CM

## 2024-06-15 DIAGNOSIS — Z09 Encounter for follow-up examination after completed treatment for conditions other than malignant neoplasm: Secondary | ICD-10-CM | POA: Insufficient documentation

## 2024-06-15 LAB — COMPREHENSIVE METABOLIC PNL, FASTING
ALBUMIN/GLOBULIN RATIO: 1.5 — ABNORMAL HIGH (ref 0.8–1.4)
ALBUMIN: 4.5 g/dL (ref 3.5–5.7)
ALKALINE PHOSPHATASE: 60 U/L (ref 34–104)
ALT (SGPT): 34 U/L (ref 7–52)
ANION GAP: 6 mmol/L (ref 4–13)
AST (SGOT): 25 U/L (ref 13–39)
BILIRUBIN TOTAL: 0.4 mg/dL (ref 0.3–1.0)
BUN/CREA RATIO: 11 (ref 6–22)
BUN: 7 mg/dL (ref 7–25)
CALCIUM, CORRECTED: 9 mg/dL (ref 8.9–10.8)
CALCIUM: 9.4 mg/dL (ref 8.6–10.3)
CHLORIDE: 103 mmol/L (ref 98–107)
CO2 TOTAL: 29 mmol/L (ref 21–31)
CREATININE: 0.66 mg/dL (ref 0.60–1.30)
ESTIMATED GFR: 112 mL/min/1.73mˆ2 (ref >59)
GLOBULIN: 3.1 (ref 2.0–3.5)
GLUCOSE: 87 mg/dL (ref 74–109)
OSMOLALITY, CALCULATED: 273 mosm/kg (ref 270–290)
POTASSIUM: 4.1 mmol/L (ref 3.5–5.1)
PROTEIN TOTAL: 7.6 g/dL (ref 6.4–8.9)
SODIUM: 138 mmol/L (ref 136–145)

## 2024-06-15 LAB — CBC WITH DIFF
BASOPHIL #: 0.1 x10ˆ3/uL (ref 0.00–0.10)
BASOPHIL %: 1 % (ref 0–1)
EOSINOPHIL #: 0.3 x10ˆ3/uL (ref 0.00–0.50)
EOSINOPHIL %: 4 % (ref 1–7)
HCT: 40.3 % (ref 31.2–41.9)
HGB: 14 g/dL (ref 10.9–14.3)
LYMPHOCYTE #: 2.2 x10ˆ3/uL (ref 1.10–3.10)
LYMPHOCYTE %: 24 % (ref 16–46)
MCH: 28.8 pg (ref 24.7–32.8)
MCHC: 34.7 g/dL (ref 32.3–35.6)
MCV: 83 fL (ref 75.5–95.3)
MONOCYTE #: 0.6 x10ˆ3/uL (ref 0.20–0.90)
MONOCYTE %: 7 % (ref 4–11)
MPV: 7.1 fL — ABNORMAL LOW (ref 7.9–10.8)
NEUTROPHIL #: 6 x10ˆ3/uL (ref 1.90–8.20)
NEUTROPHIL %: 65 % (ref 43–77)
PLATELETS: 295 x10ˆ3/uL (ref 140–440)
RBC: 4.85 x10ˆ6/uL (ref 3.63–4.92)
RDW: 13.1 % (ref 12.3–17.7)
WBC: 9.3 x10ˆ3/uL (ref 3.8–11.8)

## 2024-06-15 LAB — URINALYSIS, MICROSCOPIC
RBCS: <1 /HPF (ref <4)
SQUAMOUS EPITHELIAL: 4 /HPF (ref <28)
WBCS: 1 /HPF (ref <6)

## 2024-06-15 LAB — URINALYSIS, MACROSCOPIC
BILIRUBIN: NEGATIVE mg/dL
BLOOD: NEGATIVE mg/dL
GLUCOSE: NEGATIVE mg/dL
KETONES: NEGATIVE mg/dL
LEUKOCYTES: NEGATIVE WBCs/uL
NITRITE: NEGATIVE
PH: 7.5 (ref 5.0–9.0)
PROTEIN: NEGATIVE mg/dL
SPECIFIC GRAVITY: 1.007 (ref 1.002–1.030)
UROBILINOGEN: NORMAL mg/dL

## 2024-06-15 LAB — LIPID PANEL
CHOL/HDL RATIO: 3.1
CHOLESTEROL: 162 mg/dL (ref <200)
HDL CHOL: 52 mg/dL (ref >=40)
LDL CALC: 83 mg/dL (ref 0–100)
TRIGLYCERIDES: 135 mg/dL (ref <=150)
VLDL CALC: 27 mg/dL (ref 0–50)

## 2024-06-15 LAB — IRON TRANSFERRIN AND TIBC
IRON (TRANSFERRIN) SATURATION: 23 % (ref 15–50)
IRON: 73 ug/dL (ref 50–212)
TOTAL IRON BINDING CAPACITY: 316 ug/dL (ref 250–450)
TRANSFERRIN: 226 mg/dL (ref 203–362)
UIBC: 243 ug/dL (ref 130–375)

## 2024-06-15 LAB — THYROID STIMULATING HORMONE WITH FREE T4 REFLEX: TSH: 4.284 u[IU]/mL (ref 0.450–5.330)

## 2024-06-15 NOTE — Nursing Note (Signed)
 Patient presents today as an ER follow up.

## 2024-06-15 NOTE — ED Triage Notes (Signed)
 PCP refer to er for abn CT scan of neck . Hx thyroid  surgery last Monday

## 2024-06-15 NOTE — ED Nurses Note (Signed)
 Pt evaluated and treated by medical provider while in waiting room area. No primary RN assigned; therefore, no assessment performed. All paperwork given and questions answered. Pt acknowledged all understanding. Leaving waiting area via ambulation.

## 2024-06-15 NOTE — ED Provider Notes (Signed)
 [facility]  ED Primary Provider Note  Patient Name: Stacy Cantrell  Patient Age: 43 y.o.  Date of Birth: 1981/06/21    Chief Complaint: Abnormal Radiology Test        History of Present Illness       Stacy Cantrell is a 43 y.o. female who had concerns including Abnormal Radiology Test. ***  History of Present Illness  Stacy Cantrell is a 43 year old female who presents with difficulty swallowing and nausea following thyroid  surgery. She was referred by her primary doctor after a CT scan showed abnormalities.    She underwent a partial thyroidectomy on June 06, 2024. Following the surgery, she has experienced difficulty swallowing, describing it as feeling like 'something's stuck.'    A CT scan of the neck was performed on June 11, 2024, which showed abnormalities, prompting her primary doctor to refer her for further evaluation.    She has experienced nausea and vomiting since Tuesday, with episodes of hematemesis. She has been unable to keep food down until today, when she was able to eat without vomiting.    She has been taking Zofran  and Compazine  for nausea, with today being the first day she could keep food down. She last took Compazine  last night.    She was prescribed oxycodone  5 mg for pain and a muscle relaxer but has not taken them due to fear of exacerbating her nausea.    She reports allergies to penicillin and Benadryl. She reports feeling better today. No known allergies to penicillin and Benadryl.         Review of Systems     No other overt Review of Systems are noted to be positive except noted in the HPI.    { Be sure to review and modify ROS as appropriate. As of Jun 30, 2021 you are only required to have a medically appropriate ROS and PE for billing purposes. This help text will disappear when signing your note.:123}  Historical Data   History Reviewed This Encounter: Medical History  Surgical History  Family History  Social  History      Physical Exam   ED Triage Vitals [06/15/24 1327]   BP (Non-Invasive) (!) 136/90   Heart Rate 99   Respiratory Rate 20   Temperature 36.3 C (97.3 F)   SpO2 100 %   Weight 102 kg (224 lb)   Height 1.615 m (5' 3.6)     {Be sure to review and modify Physical Exam as appropriate. As of Jun 30, 2021 you are only required to have a medically appropriate ROS and PE for billing purposes. This help text will disappear when signing your note.:123}    Nursing notes reviewed for what could be assessed. Past Medical, Surgical, and Social history reviewed for what has been completed.     Constitutional: NAD. Well-Developed. Well Nourished.  Head: Normocephalic, atraumatic.  Mouth/Throat: no nasal discharge, posterior pharynx WNL  Eyes: EOM grossly intact, conjunctiva normal.  Neck: Supple  Cardiovascular: Regular Rate and Rhythm, extremities well perfused.  Pulmonary/Chest: No respiratory distress. Lungs are symmetric to auscultation bilaterally.  Abdominal: Soft, non-tender, non-distended. Non peritoneal, no rebound, no guarding.  MSK: No Lower Extremity Edema.  Skin: Warm, dry, and intact  Neuro: Appropriate, CN II-XII grossly intact   Psych: Cooperative     Physical Exam           Procedures      Patient Data   {Click here  to open the ED Workup Activity for clinical data review *This link will automatically disappear upon signing your note*:123}Labs Ordered/Reviewed - No data to display    No orders to display       Medical Decision Making        {Be sure to fill out the MDM SmartBlock in Notewriter to the left. Do not modify this italicized text, it will disappear upon signing your note:123}  Medical Decision Making        Studies Assessed: ***    EKG:   This EKG interpreted by me shows:    Rate: ***    Interpretation: ***      MDM Narrative:  ***  Medical Decision Making  43 year old female with history of partial thyroidectomy on December 8th presented with persistent but improving dysphagia and recent  nausea, vomiting, and hematemesis. She reported difficulty swallowing and sensation of something stuck in her throat since surgery, as well as several days of vomiting with some blood, now resolved and able to tolerate oral intake. She denied fever, chills, chest pain, shortness of breath, or abdominal pain. Examination and review of recent neck CT showed expected post-surgical changes without evidence of acute complication.    Differential diagnosis includes, but is not limited to:  - Post-thyroidectomy changes with dysphagia: Dysphagia and throat discomfort are likely due to post-surgical inflammation or irritation, with imaging showing expected post-operative findings and no evidence of acute complication.  - Medication-induced nausea and vomiting: Nausea and vomiting may be related to post-operative medications or anesthesia, with improvement noted and no ongoing symptoms at presentation.  - Gastrointestinal bleeding (hematemesis) secondary to vomiting: Episodes of hematemesis were likely due to mucosal irritation from repeated vomiting, with no ongoing bleeding or abdominal pain and resolution of symptoms.    Post-thyroidectomy with dysphagia and nausea/vomiting  - Advised to contact the surgeon's office for any concerns or changes in symptoms.  - Encouraged use of prescribed pain medication if needed, with antiemetics to prevent nausea.  - Advised to continue antiemetics as needed to control nausea.  - Instructed to return to the emergency department if symptoms worsen or if she experiences fever, increased vomiting, or other concerning symptoms.  - Advised to follow up with her family physician and attend scheduled post-operative visit with the surgeon.                   Following the history, physical exam, and ED workup, the patient was deemed stable and suitable for discharge. The patient/caregiver was advised to return to the ED for any new or worsening symptoms. Discharge medications, and follow-up  instructions were discussed with the patient/caregiver in detail, who verbalizes understanding. The patient/caregiver is in agreement and is comfortable with the plan of care.    Disposition: Discharged         Current Discharge Medication List        CONTINUE these medications - NO CHANGES were made during your visit.        Details   ARIPiprazole  2 mg Tablet  Commonly known as: ABILIFY    2 mg, Oral, Daily  Qty: 90 Tablet  Refills: 1     Centrum 18-400 mg-mcg Tablet  Generic drug: multivitamin-iron-folic acid    1 Tablet, Daily  Refills: 0     cetirizine  10 mg Tablet  Commonly known as: zyrTEC    10 mg, Oral, Daily  Qty: 90 Tablet  Refills: 1     cyanocobalamin  1,000 mcg/mL Solution  Commonly known as:  VITAMIN B12   INJECT 1ML UNDER THE SKIN EVERY 30 DAYS  Qty: 9 mL  Refills: 1     cyclobenzaprine  10 mg Tablet  Commonly known as: FLEXERIL    10 mg, Oral, 3 TIMES DAILY PRN  Qty: 12 Tablet  Refills: 0     escitalopram  oxalate 20 mg Tablet  Commonly known as: LEXAPRO    20 mg, Oral, Daily  Qty: 90 Tablet  Refills: 1     ketorolac  tromethamine  10 mg Tablet  Commonly known as: TORADOL    10 mg, Oral, EVERY 6 HOURS PRN  Qty: 20 Tablet  Refills: 0     metFORMIN  500 mg Tablet  Commonly known as: GLUCOPHAGE    500 mg, Oral, EVERY MORNING WITH BREAKFAST  Qty: 90 Tablet  Refills: 1     olmesartan  40 mg Tablet  Commonly known as: BENICAR    40 mg, Oral, Daily  Qty: 90 Tablet  Refills: 1     ondansetron  4 mg Tablet, Rapid Dissolve  Commonly known as: ZOFRAN  ODT   4 mg, Oral, EVERY 8 HOURS PRN  Qty: 12 Tablet  Refills: 0     oxyCODONE  5 mg Tablet  Commonly known as: ROXICODONE    1 Tablet, EVERY 6 HOURS PRN  Refills: 0     rosuvastatin  5 mg Tablet  Commonly known as: CRESTOR    5 mg, Oral, EVERY EVENING  Qty: 90 Tablet  Refills: 1     semaglutide  0.25 mg or 0.5 mg (2 mg/3 mL) Pen Injector  Commonly known as: OZEMPIC    0.5 mg, Subcutaneous, EVERY 7 DAYS  Qty: 3 mL  Refills: 2     temazepam  7.5 mg Capsule  Commonly known as: RESTORIL    7.5  mg, Oral, NIGHTLY PRN  Qty: 90 Capsule  Refills: 0     topiramate  50 mg Tablet  Commonly known as: TOPAMAX    50 mg, Oral, Daily  Qty: 90 Tablet  Refills: 1            Follow up:   Alvis, Amy, PA-C  [address]  [address]  [place] [address]  [phone]    In 1 day      [facility] - Emergency Department  [address]  [place]  [address]  [phone]    As needed, If symptoms worsen               Clinical Impression   Nausea and vomiting (Primary)   H/O partial thyroidectomy         Current Discharge Medication List            Megan Cork, DO              {Remember to refresh your note prior to signing. Use Control + F11 or click the refresh button at the bottom of the note. This reminder text will automatically disappear when you sign your note.:123}

## 2024-06-15 NOTE — Nursing Note (Deleted)
 Patient presents today for routine annual exam.

## 2024-06-15 NOTE — ED Triage Notes (Signed)
 [facility] - Emergency Department   Physician/APP in Triage Note  Medical Screening Exam     Date and Time of Assessment: 06/15/2024 13:33     Chief Complaint   Patient presents with    Abnormal Radiology Test       History of Present Illness  Stacy Cantrell is a 43 year old female who presents with difficulty swallowing and vomiting following a partial thyroidectomy.    She underwent a partial thyroidectomy last Monday at [facility]. Since the surgery, she has experienced difficulty swallowing and vomiting, including hematemesis. She was unable to retain any food or liquids so she went to [facility] ER.     A CT scan of the neck performed at [facility] ER revealed a metal clip and fluid in the right thyroid  bed, which the patient reports appeared larger on subsequent imaging. She has had two CT scans, which also showed the presence of the clip.        Focused Physical Exam:   ED Triage Vitals [06/15/24 1327]   BP (Non-Invasive) (!) 136/90   Heart Rate 99   Respiratory Rate 20   Temperature 36.3 C (97.3 F)   SpO2 100 %   Weight 102 kg (224 lb)   Height 1.615 m (5' 3.6)     Physical Exam  Gen: Nontoxic in appearance.  Head:  Atraumatic  Skin: Warm, dry.  Psych: Patient does have capacity.     Assessment: Medical Screening Exam.     Plan/MDM at Triage:  I have considered labs, imaging, EKG.  These have been ordered as clinically indicated.  I have considered that the patient may need medications.  However, due to treatment space limitation, medications considered have to be appropriate for the waiting room.    I have considered the patient's chronic conditions and have counseled the patient that we will be evaluating for the presence of an emergency medical condition today in which their chronic conditions may or may not play a role.  I have advised that the patient should notify their PCP of today's visit regardless of disposition.  I have considered the patient's social determinants of  health and health care literacy while determining the patient's initial plan of care.  I have further advised that the patient will possibly see a 2nd emergency provider today during their stay to help promote throughput.     I have performed an initial medical screening evaluation in order to determine if the patient has an emergency medical condition. The patient has been evaluated.  Imminent life, limb, or sight threatening emergency will be taken to the treatment area immediately. The patient's orders will be reviewed by nursing staff and placed in the treatment area as soon as possible as bed capacity allows.  Should the patient's status change, or a new sign/symptom develop, immediate notification of nursing or ED staff has been advised.          Preliminary Plan:  Patient will return to waiting room    Medical Decision Making

## 2024-06-15 NOTE — Progress Notes (Signed)
 [facility]  [address]  [place] [address]  Operated by [facility]      Name: Stacy Cantrell MRN:  [id number]   Date: 06/15/2024 DOB:  March 01, 1981 (43 y.o.)          Chief Complaint: ED Follow-up     This note was created with assistance from [employer] via capture of conversational audio.  Consent was obtained from the patient prior to recording.    History of Present Illness  Stacy Cantrell is a 43 year old female who presents for ER follow-up.  Patient presented to the emergency room with complaints of persistent vomiting and concerns following recent thyroid  surgery.    She has been experiencing persistent vomiting and an inability to keep anything down since last Tuesday, which led her to visit the ER. During the ER visit, a white blood cell count of 12.3 was noted. A CT scan of the soft tissue was performed, revealing fluid and soft tissue stranding in the anterior neck, anterior to the right thyroid  bed, and a metallic density in the soft tissues. She feels like she is 'strangling' and experiences vomiting, with sensations of food getting stuck when swallowing, which she notes occurred even before her surgery.    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 Postoperatively, she has been experiencing significant discomfort, including skin peeling from excessive vomiting and difficulty moving her head due to pain. She mentions that her surgery lasted almost three hours.    She has been attempting to manage her symptoms with a liquid to soft blend diet but reports difficulty keeping down various fluids such as apple juice, Gatorade, and pineapple juice. She has not tried Pedialyte yet. She describes a sensation of food or liquid getting 'stuck' and has been using a straw to drink, which she finds painful.    She is requesting her routine labs to follow-up on cholesterol levels today as  well as repeat RA ANA for continued joint pain issues.        ER evaluation noted:    Altha Anes, DO  Last attending  Treatment team Bilious vomiting with nausea +3 more  Clinical impression Vomiting   Chief complaint     ED Provider Notes  Altha Anes, DO (Physician)  EMERGENCY  Expand All Collapse All     [facility] [place] - Emergency Department  ED Primary Note  History of Present Illness  Stacy Cantrell is a 43 y.o. female who had concerns including Vomiting.  This 43 year old female patient presents emergency department today, she is here from follow up after having a right thyroid  lobectomy on Tuesday, started vomiting today and she has been vomiting blood and feels like there has a not on her thyroid  and she is having difficulty breathing and swallowing.  She has not been around any way with illness, has a only taking 1 oxycodone  and neck with a day after surgery,     Past medical history:  Depression, anxiety, gastroenteritis, insomnia, headache syndromes, allergic rhinitis, thyroid  cancer.    Past surgical history:  Right lobe thyroidectomy, hysterectomy, carpal tunnel release right, vascular surgery on the right and left knee arthroscopy.    Social history: Former smoker never vaped, never use marijuana or illicit drugs.  She does use social alcohol.     Physical Exam      ED Triage Vitals [06/10/24 1828]  BP (Non-Invasive) (!) 161/117   Heart Rate (!) 125   Respiratory Rate (!) 22   Temperature 36.8 C (98.2 F)   SpO2 100 %   Weight 102 kg (225 lb)   Height 1.6 m (5' 3)      Physical Exam  General:  No acute distress nontoxic, anxious   Nasal:  Mildly injected, moist.    Oral:  Oral cavity is pink and moist   Pharynx:  Pink and moist without PND, exudate, petechiae, pharynx is wide open and patent as visualized with a tongue depressor.    Neck:  Adenopathy, she does has a central to right lower neck transverse incision still sealed, closely approximated, does have some mild  expected erythema and fullness developing along the incision line.  No other signs of bleeding, or hematoma.  Lungs:  Clear symmetrical good aeration   Heart: Regular rate rhythm without murmur.     Patient Data        Labs Ordered/Reviewed   CBC WITH DIFF - Abnormal; Notable for the following components:       Result Value      WBC 12.3 (*)       RBC 5.35 (*)       HGB 15.4 (*)       HCT 45.7 (*)       MPV 6.9 (*)       EOSINOPHIL # 0.87 (*)       All other components within normal limits   CBC/DIFF     Narrative:      The following orders were created for panel order CBC/DIFF.  Procedure                               Abnormality         Status                     ---------                               -----------         ------                     CBC WITH IPQQ[[id number]]                Abnormal            Final result                  Please view results for these tests on the individual orders.   BASIC METABOLIC PANEL     Narrative:      Estimated Glomerular Filtration Rate (eGFR) is calculated using the CKD-EPI (2021) equation, intended for patients 6 years of age and older. If gender is not documented or unknown, there will be no eGFR calculation.      CT SOFT TISSUE NECK WO IV CONTRAST   Final Result by Edi, Radresults In (12/12 1932)   1.THERE IS FLUID AND SOFT TISSUE STRANDING IN THE ANTERIOR NECK ANTERIOR TO THE RIGHT THYROID  BED. THERE IS A METALLIC DENSITY IN THE SOFT TISSUES ANTERIOR TO THE TRACHEA. NO MASS EFFECT UPON THE AIRWAY.                   Radiologist location ID: [id number]  Medical Decision Making        Medical Decision Making  This 43 year old female patient presents emergency department with nausea and vomiting since Tuesday, has a right lobe thyroidectomy on Monday secondary to thyroid  cancer.  She only took 1 oxycodone , doubt that that is continuing nausea and vomiting.  She has had no fever, headaches or body aches beyond the norm and denies any illness at home were  illness exposures.  She has a no diarrhea but does has a nausea and vomiting.  Patient does not have Zofran  at home.  She has a moist mucosal membranes, pharynx is patent in the visualized portion of the pharynx with a tongue depressor, neck is supple, no signs of hematoma around the surgical incision site, the expected firmness of the incision inflammatory changes with no drainage.  Her neck is otherwise supple, lungs are clear symmetrical, heart regular rate rhythm without murmur.    Differential includes Mallory-Weiss tear for hematemesis, gastroenteritis, postoperative pain, anxiety.    Labs:  Labs essentially unremarkable, 12.3 white count with neutrophilic shift which has a expected, BNP is normal.    CT soft tissue neck:  Airway patent, no compression, inflammatory changes postoperatively from the surgery is noted.    Amount and/or Complexity of Data Reviewed  Labs: ordered.     Details: Labs reviewed and noted  Radiology: ordered.     Details: CT soft tissue neck reviewed and noted    Risk  Prescription drug management.             Clinical Impression   Bilious vomiting with nausea (Primary)   Anxiety   Postoperative pain   Thyroid  cancer (CMS HCC)         Disposition: Discharged                   Allergies:  Allergies[1]    Current Medications:  ARIPiprazole  (ABILIFY ) 2 mg Oral Tablet, Take 1 Tablet (2 mg total) by mouth Daily  cetirizine  (ZYRTEC ) 10 mg Oral Tablet, TAKE 1 TABLET BY MOUTH EVERY DAY AS NEEDED  cyanocobalamin  (VITAMIN B12) 1,000 mcg/mL Injection Solution, INJECT 1ML UNDER THE SKIN EVERY 30 DAYS  cyclobenzaprine  (FLEXERIL ) 10 mg Oral Tablet, Take 1 Tablet (10 mg total) by mouth Three times a day as needed for Muscle spasms  escitalopram  oxalate (LEXAPRO ) 20 mg Oral Tablet, Take 1 Tablet (20 mg total) by mouth Daily  ketorolac  tromethamine  (TORADOL ) 10 mg Oral Tablet, Take 1 Tablet (10 mg total) by mouth Every 6 hours as needed for Pain  metFORMIN  (GLUCOPHAGE ) 500 mg Oral Tablet, Take 1 Tablet  (500 mg total) by mouth Every morning with breakfast  multivitamin-iron-folic acid  (CENTRUM) 18-400 mg-mcg Oral Tablet, Take 1 Tablet by mouth Once a day  olmesartan  (BENICAR ) 40 mg Oral Tablet, Take 1 Tablet (40 mg total) by mouth Daily  ondansetron  (ZOFRAN  ODT) 4 mg Oral Tablet, Rapid Dissolve, Take 1 Tablet (4 mg total) by mouth Every 8 hours as needed for Nausea/Vomiting for up to 7 days  oxyCODONE  (ROXICODONE ) 5 mg Oral Tablet, Take 1 Tablet (5 mg total) by mouth Every 6 hours as needed  rosuvastatin  (CRESTOR ) 5 mg Oral Tablet, Take 1 Tablet (5 mg total) by mouth Every evening  semaglutide  (OZEMPIC ) 0.25 mg or 0.5 mg (2 mg/3 mL) Subcutaneous Pen Injector, Inject 0.5 mg under the skin Every 7 days  temazepam  (RESTORIL ) 7.5 mg Oral Capsule, Take 1 Capsule (7.5 mg total) by mouth Every night as needed for Insomnia  topiramate  (TOPAMAX ) 50 mg Oral Tablet, Take 1 Tablet (50 mg total) by mouth Once a day    No facility-administered medications prior to visit.       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  HENT: Right Ear Tympanic membrane normal, Left Ear Tympanic membrane normal, Mouth Mucous membranes are moist, Pharynx Oropharynx is clear.  EYES: Pupils are equal, round, and reactive to light.  CARDIOVASCULAR: Normal rate and regular rhythm, Normal pulses, Normal heart sounds.  PULMONARY: Pulmonary effort is normal, Normal breath sounds.  ABDOMINAL: Bowel sounds are normal, Abdomen is soft, There is no abdominal  tenderness.  MUSCULOSKELETAL: No swelling or tenderness, Normal range of motion, Normal range of motion and neck supple.  SKIN: Skin is warm, No lesion or rash.  NEUROLOGICAL: No focal deficit present, She is alert and oriented to person, place, and time.  PSYCHIATRIC: Mood normal, Behavior normal, Thought content normal, Judgment normal.         Results  Labs  WBC (06/08/2024): 12.3    Radiology  CT neck soft tissue (06/08/2024): Fluid and soft tissue stranding in the anterior neck anterior to the right thyroid  bed; metallic density in the soft tissues anterior to the trachea; no mass effect (Independently interpreted)  CT cervical spine: Metallic density present prior to thyroid  surgery (Independently interpreted)     Assessment & Plan  Hospital discharge follow-up  Recent ER evaluation with chart reviewed in detail.  Dysphagia, unspecified type  Persistent dysphagia and vomiting post-thyroidectomy with CT showing fluid, soft tissue stranding, and metallic density in anterior neck. Differential includes inflammation or foreign body obstruction. Concerns about obstruction or retained surgical material.  - Advised liquid to soft blended diet.  - Recommended Pedialyte for hydration.  - Consulted with Dr. Tobie for further evaluation./EGD to assess for obstruction or retained material.  - Instructed to return to ER any increase in symptoms of concern  Vomiting, unspecified vomiting type, unspecified whether nausea present  Persistent dysphagia and vomiting post-thyroidectomy with CT showing fluid, soft tissue stranding, and metallic density in anterior neck. Differential includes inflammation or foreign body obstruction. Concerns about obstruction or retained surgical material.  - Advised liquid to soft blended diet.  - Recommended Pedialyte for hydration.  - Consulted with surgeon for further evaluation./EGD to assess for obstruction or retained material.  - Instructed to return to ER any increase in symptoms of  concern  Abnormal CT scan, neck    Persistent neck swelling and soft tissue changes post-thyroidectomy with CT showing soft tissue stranding and metallic density, possibly related to surgical artifacts or retained material. Swelling may contribute to dysphagia and vomiting.  - Continue ice packs for swelling management.  - Monitor for changes in swelling  and symptoms.  - Follow up with surgeon to discuss CT findings and potential need for further evaluation.  Hypertension, unspecified type  Elevated blood pressure with history of hypertension noted.  Patient currently on Benicar  20 mg daily.  -increase Benicar  40 mg daily  -continue blood pressure monitoring  Elevated cholesterol  History of elevated cholesterol with current use of Crestor  5 mg nightly.  Weight gain once again reported.  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  Requests RA levels in lab.  -lab work obtained to include RA ANA    Orders Placed This Encounter    URINE CULTURE,ROUTINE    THYROID  STIMULATING HORMONE WITH FREE T4 REFLEX    CBC/DIFF    COMPREHENSIVE METABOLIC PNL, FASTING    URINALYSIS, MACROSCOPIC AND MICROSCOPIC W/CULTURE REFLEX    LIPID PANEL    IRON TRANSFERRIN AND TIBC    CBC WITH DIFF    URINALYSIS, MACROSCOPIC    URINALYSIS, MICROSCOPIC    HEP-2 SUBSTRATE ANTINUCLEAR ANTIBODIES (ANA), SERUM    RHEUMATOID FACTOR, SERUM    Referral to GENERAL SURGERY - [place] - DUREMDES, STEEN BUD          Post-Discharge Follow Up Appointments       Monday Sep 12, 2024    Return Patient Visit with Scharlene No, PA-C at 10:00 AM      Wednesday Sep 14, 2024    New Patient Visit with Waverly Grate, PA-C at 10:30 AM      Tuesday Jun 20, 2025    Return Patient Visit with Scharlene No, PA-C at 10:00 AM      Internal Medicine, Building A  [facility]  [address]  [place]  [address]  [phone] Urology, [facility] [place], [place]  [address]  [place] [address]  [phone]             This note was partially created using M[employer] Fluency Direct system (voice recognition software) and is inherently subject to errors including those of syntax and sound-alike substitutions which may escape proofreading.  In such instances, original meaning may be extrapolated by contextual derivation.    Torii Royse, PA-C    This note was created with assistance from [employer] via capture of conversational audio. Consent was obtained from the patient and all parties present prior to recording.               [1]   Allergies  Allergen Reactions    Codeine Hives/ Urticaria    Diphenhydramine Hcl Hives/ Urticaria    Penicillins Hives/ Urticaria    Diphenhydramine Itching   [2]   Social History  Tobacco Use    Smoking status: Former     Current packs/day: 0.00     Average packs/day: 0.5 packs/day for 15.7 years (7.9 ttl pk-yrs)     Types: Cigarettes     Start date: 10/06/1998     Quit date: 06/30/2014     Years since quitting: 10.0    Smokeless tobacco: Never   Vaping Use    Vaping status: Never Used   Substance Use Topics    Alcohol use: Yes     Alcohol/week: 4.0 standard drinks of alcohol     Types: 4 Glasses of wine per week    Drug use: Never

## 2024-06-16 ENCOUNTER — Telehealth (INDEPENDENT_AMBULATORY_CARE_PROVIDER_SITE_OTHER): Payer: Self-pay | Admitting: Physician Assistant

## 2024-06-16 ENCOUNTER — Other Ambulatory Visit: Payer: Self-pay

## 2024-06-17 ENCOUNTER — Ambulatory Visit: Admission: RE | Admit: 2024-06-17 | Discharge: 2024-06-17 | Payer: Self-pay | Attending: Physician Assistant

## 2024-06-17 ENCOUNTER — Ambulatory Visit (INDEPENDENT_AMBULATORY_CARE_PROVIDER_SITE_OTHER): Payer: Self-pay

## 2024-06-17 DIAGNOSIS — N281 Cyst of kidney, acquired: Secondary | ICD-10-CM | POA: Insufficient documentation

## 2024-06-17 LAB — URINE CULTURE,ROUTINE: URINE CULTURE: >100000 — AB

## 2024-06-17 LAB — RHEUMATOID FACTOR, SERUM: RHEUMATOID FACTOR: <13 [IU]/mL (ref <=30)

## 2024-06-17 LAB — HEP-2 SUBSTRATE ANTINUCLEAR ANTIBODIES (ANA), SERUM: ANA INTERPRETATION: NEGATIVE

## 2024-06-17 NOTE — Telephone Encounter (Signed)
 Attempted to call patient regarding lab results, no answer; left message for patient to call the office.

## 2024-06-20 ENCOUNTER — Ambulatory Visit (INDEPENDENT_AMBULATORY_CARE_PROVIDER_SITE_OTHER): Payer: Self-pay

## 2024-06-20 DIAGNOSIS — N281 Cyst of kidney, acquired: Secondary | ICD-10-CM

## 2024-06-22 ENCOUNTER — Other Ambulatory Visit (INDEPENDENT_AMBULATORY_CARE_PROVIDER_SITE_OTHER): Payer: Self-pay | Admitting: Physician Assistant

## 2024-07-25 NOTE — Assessment & Plan Note (Signed)
 History of elevated cholesterol with current use of Crestor  5 mg nightly.  Weight gain once again reported.  -obtain cholesterol levels in blood work  -continue Crestor  5 mg nightly with diet

## 2024-07-25 NOTE — Assessment & Plan Note (Signed)
 Elevated blood pressure with history of hypertension noted.  Patient currently on Benicar  20 mg daily.  -increase Benicar  40 mg daily  -continue blood pressure monitoring

## 2024-09-12 ENCOUNTER — Ambulatory Visit (INDEPENDENT_AMBULATORY_CARE_PROVIDER_SITE_OTHER): Payer: Self-pay | Admitting: Physician Assistant

## 2024-09-14 ENCOUNTER — Ambulatory Visit (INDEPENDENT_AMBULATORY_CARE_PROVIDER_SITE_OTHER): Payer: Self-pay | Admitting: Physician Assistant

## 2024-12-26 ENCOUNTER — Ambulatory Visit (INDEPENDENT_AMBULATORY_CARE_PROVIDER_SITE_OTHER): Admitting: Physician Assistant

## 2025-06-20 ENCOUNTER — Ambulatory Visit (INDEPENDENT_AMBULATORY_CARE_PROVIDER_SITE_OTHER): Payer: Self-pay | Admitting: Physician Assistant
